# Patient Record
Sex: Male | Born: 1960 | Race: White | Hispanic: No | Marital: Married | Smoking: Never smoker
Health system: Southern US, Community
[De-identification: ages and names within clinical notes are randomized; demographics above are authoritative.]

## PROBLEM LIST (undated history)

## (undated) DIAGNOSIS — R7303 Prediabetes: Secondary | ICD-10-CM

## (undated) DIAGNOSIS — N400 Enlarged prostate without lower urinary tract symptoms: Secondary | ICD-10-CM

## (undated) DIAGNOSIS — K222 Esophageal obstruction: Secondary | ICD-10-CM

## (undated) DIAGNOSIS — M722 Plantar fascial fibromatosis: Secondary | ICD-10-CM

## (undated) DIAGNOSIS — G2581 Restless legs syndrome: Secondary | ICD-10-CM

## (undated) DIAGNOSIS — N62 Hypertrophy of breast: Secondary | ICD-10-CM

## (undated) DIAGNOSIS — E0590 Thyrotoxicosis, unspecified without thyrotoxic crisis or storm: Secondary | ICD-10-CM

## (undated) DIAGNOSIS — Z8639 Personal history of other endocrine, nutritional and metabolic disease: Secondary | ICD-10-CM

## (undated) DIAGNOSIS — E039 Hypothyroidism, unspecified: Secondary | ICD-10-CM

## (undated) DIAGNOSIS — K219 Gastro-esophageal reflux disease without esophagitis: Secondary | ICD-10-CM

## (undated) DIAGNOSIS — G4730 Sleep apnea, unspecified: Secondary | ICD-10-CM

## (undated) DIAGNOSIS — J849 Interstitial pulmonary disease, unspecified: Secondary | ICD-10-CM

## (undated) DIAGNOSIS — H33311 Horseshoe tear of retina without detachment, right eye: Secondary | ICD-10-CM

## (undated) DIAGNOSIS — I4891 Unspecified atrial fibrillation: Secondary | ICD-10-CM

## (undated) DIAGNOSIS — Z8619 Personal history of other infectious and parasitic diseases: Secondary | ICD-10-CM

## (undated) HISTORY — PX: FRACTURE SURGERY: SHX138

## (undated) HISTORY — DX: Interstitial pulmonary disease, unspecified: J84.9

## (undated) HISTORY — DX: Personal history of other infectious and parasitic diseases: Z86.19

## (undated) HISTORY — PX: UVULOPALATOPHARYNGOPLASTY: SHX827

## (undated) HISTORY — PX: COLONOSCOPY WITH ESOPHAGOGASTRODUODENOSCOPY (EGD): SHX5779

## (undated) HISTORY — PX: ATRIAL FIBRILLATION ABLATION: SHX5732

## (undated) HISTORY — DX: Unspecified atrial fibrillation: I48.91

## (undated) HISTORY — PX: BREAST BIOPSY: SHX20

## (undated) HISTORY — DX: Horseshoe tear of retina without detachment, right eye: H33.311

## (undated) HISTORY — DX: Benign prostatic hyperplasia without lower urinary tract symptoms: N40.0

---

## 2006-01-12 ENCOUNTER — Ambulatory Visit: Payer: Self-pay | Admitting: Gastroenterology

## 2006-09-06 ENCOUNTER — Emergency Department: Payer: Self-pay | Admitting: Emergency Medicine

## 2006-09-06 ENCOUNTER — Other Ambulatory Visit: Payer: Self-pay

## 2006-12-16 ENCOUNTER — Ambulatory Visit: Payer: Self-pay | Admitting: Internal Medicine

## 2006-12-21 DIAGNOSIS — Z8619 Personal history of other infectious and parasitic diseases: Secondary | ICD-10-CM

## 2006-12-21 HISTORY — DX: Personal history of other infectious and parasitic diseases: Z86.19

## 2006-12-23 ENCOUNTER — Ambulatory Visit: Payer: Self-pay | Admitting: Internal Medicine

## 2007-01-20 ENCOUNTER — Emergency Department: Payer: Self-pay | Admitting: Emergency Medicine

## 2007-11-15 ENCOUNTER — Ambulatory Visit: Payer: Self-pay | Admitting: Gastroenterology

## 2008-12-21 HISTORY — PX: BUNIONECTOMY: SHX129

## 2009-12-21 DIAGNOSIS — D352 Benign neoplasm of pituitary gland: Secondary | ICD-10-CM

## 2009-12-21 DIAGNOSIS — I959 Hypotension, unspecified: Secondary | ICD-10-CM | POA: Diagnosis present

## 2009-12-21 HISTORY — DX: Benign neoplasm of pituitary gland: D35.2

## 2009-12-21 HISTORY — PX: PITUITARY EXCISION: SHX745

## 2010-02-07 ENCOUNTER — Ambulatory Visit: Payer: Self-pay | Admitting: Urology

## 2010-05-21 ENCOUNTER — Ambulatory Visit: Payer: Self-pay | Admitting: Internal Medicine

## 2010-06-18 ENCOUNTER — Ambulatory Visit: Payer: Self-pay | Admitting: Neurosurgery

## 2010-12-17 ENCOUNTER — Ambulatory Visit: Payer: Self-pay | Admitting: Neurosurgery

## 2011-09-23 DIAGNOSIS — E291 Testicular hypofunction: Secondary | ICD-10-CM | POA: Insufficient documentation

## 2011-09-23 DIAGNOSIS — G2581 Restless legs syndrome: Secondary | ICD-10-CM | POA: Insufficient documentation

## 2011-09-23 DIAGNOSIS — E785 Hyperlipidemia, unspecified: Secondary | ICD-10-CM | POA: Insufficient documentation

## 2011-09-23 DIAGNOSIS — E038 Other specified hypothyroidism: Secondary | ICD-10-CM | POA: Diagnosis present

## 2011-09-23 DIAGNOSIS — D352 Benign neoplasm of pituitary gland: Secondary | ICD-10-CM | POA: Insufficient documentation

## 2012-11-04 ENCOUNTER — Ambulatory Visit: Payer: Self-pay | Admitting: Internal Medicine

## 2012-12-07 DIAGNOSIS — D497 Neoplasm of unspecified behavior of endocrine glands and other parts of nervous system: Secondary | ICD-10-CM | POA: Insufficient documentation

## 2012-12-09 ENCOUNTER — Ambulatory Visit: Payer: Self-pay | Admitting: Gastroenterology

## 2014-10-03 ENCOUNTER — Ambulatory Visit: Payer: Self-pay | Admitting: Neurological Surgery

## 2016-07-23 ENCOUNTER — Other Ambulatory Visit: Payer: Self-pay | Admitting: Gastroenterology

## 2016-07-23 DIAGNOSIS — R1310 Dysphagia, unspecified: Secondary | ICD-10-CM

## 2016-07-28 ENCOUNTER — Ambulatory Visit: Payer: Self-pay

## 2016-08-12 ENCOUNTER — Ambulatory Visit
Admission: RE | Admit: 2016-08-12 | Discharge: 2016-08-12 | Disposition: A | Discharge status: Home / Self Care | Payer: 59 | Source: Ambulatory Visit | Attending: Gastroenterology | Admitting: Gastroenterology

## 2016-08-12 DIAGNOSIS — K219 Gastro-esophageal reflux disease without esophagitis: Secondary | ICD-10-CM | POA: Insufficient documentation

## 2016-08-12 DIAGNOSIS — K228 Other specified diseases of esophagus: Secondary | ICD-10-CM | POA: Insufficient documentation

## 2016-08-12 DIAGNOSIS — R1310 Dysphagia, unspecified: Secondary | ICD-10-CM | POA: Insufficient documentation

## 2016-08-12 DIAGNOSIS — K449 Diaphragmatic hernia without obstruction or gangrene: Secondary | ICD-10-CM | POA: Insufficient documentation

## 2016-08-12 DIAGNOSIS — K222 Esophageal obstruction: Secondary | ICD-10-CM | POA: Diagnosis present

## 2016-10-26 ENCOUNTER — Encounter: Admission: RE | Payer: Self-pay | Source: Ambulatory Visit

## 2016-10-26 ENCOUNTER — Ambulatory Visit: Admission: RE | Admit: 2016-10-26 | Payer: 59 | Source: Ambulatory Visit | Admitting: Gastroenterology

## 2016-10-26 SURGERY — ESOPHAGOGASTRODUODENOSCOPY (EGD) WITH PROPOFOL
Anesthesia: General

## 2017-05-14 ENCOUNTER — Encounter: Admission: RE | Payer: Self-pay | Source: Ambulatory Visit

## 2017-05-14 ENCOUNTER — Ambulatory Visit: Admission: RE | Admit: 2017-05-14 | Payer: 59 | Source: Ambulatory Visit | Admitting: Gastroenterology

## 2017-05-14 SURGERY — ESOPHAGOGASTRODUODENOSCOPY (EGD) WITH PROPOFOL
Anesthesia: General

## 2018-05-02 DIAGNOSIS — D352 Benign neoplasm of pituitary gland: Secondary | ICD-10-CM | POA: Insufficient documentation

## 2018-05-02 DIAGNOSIS — R7303 Prediabetes: Secondary | ICD-10-CM | POA: Insufficient documentation

## 2019-02-02 DIAGNOSIS — E739 Lactose intolerance, unspecified: Secondary | ICD-10-CM | POA: Insufficient documentation

## 2019-02-07 HISTORY — PX: PARS PLANA VITRECTOMY: SHX2166

## 2020-08-06 ENCOUNTER — Other Ambulatory Visit: Payer: Self-pay | Admitting: Podiatry

## 2020-08-19 ENCOUNTER — Encounter: Payer: Self-pay | Admitting: Anesthesiology

## 2020-08-19 ENCOUNTER — Other Ambulatory Visit: Payer: Self-pay

## 2020-08-19 ENCOUNTER — Encounter: Payer: Self-pay | Admitting: Podiatry

## 2020-08-23 NOTE — Discharge Instructions (Signed)
[facility]  POST OPERATIVE INSTRUCTIONS FOR DR. TROXLER, DR. FOWLER, AND DR. [facility]   1. Take your medication as prescribed.  Pain medication should be taken only as needed.  2. Keep the dressing clean, dry and intact.  3. Keep your foot elevated above the heart level for the first 48 hours.  4. Walking to the bathroom and brief periods of walking are acceptable, unless we have instructed you to be non-weight bearing.  5. Always wear your post-op shoe when walking.  Always use your crutches if you are to be non-weight bearing.  6. Do not take a shower. Baths are permissible as long as the foot is kept out of the water.   7. Every hour you are awake:  - Bend your knee 15 times. - Flex foot 15 times - Massage calf 15 times  8. Call [facility] ([phone]) if any of the following problems occur: - You develop a temperature or fever. - The bandage becomes saturated with blood. - Medication does not stop your pain. - Injury of the foot occurs. - Any symptoms of infection including redness, odor, or red streaks running from wound.   General Anesthesia, Adult, Care After This sheet gives you information about how to care for yourself after your procedure. Your health care provider may also give you more specific instructions. If you have problems or questions, contact your health care provider. What can I expect after the procedure? After the procedure, the following side effects are common:  Pain or discomfort at the IV site.  Nausea.  Vomiting.  Sore throat.  Trouble concentrating.  Feeling cold or chills.  Weak or tired.  Sleepiness and fatigue.  Soreness and body aches. These side effects can affect parts of the body that were not involved in surgery. Follow these instructions at home:  For at least 24 hours after the procedure:  Have a responsible adult stay with you. It is  important to have someone help care for you until you are awake and alert.  Rest as needed.  Do not: ? Participate in activities in which you could fall or become injured. ? Drive. ? Use heavy machinery. ? Drink alcohol. ? Take sleeping pills or medicines that cause drowsiness. ? Make important decisions or sign legal documents. ? Take care of children on your own. Eating and drinking  Follow any instructions from your health care provider about eating or drinking restrictions.  When you feel hungry, start by eating small amounts of foods that are soft and easy to digest (bland), such as toast. Gradually return to your regular diet.  Drink enough fluid to keep your urine pale yellow.  If you vomit, rehydrate by drinking water, juice, or clear broth. General instructions  If you have sleep apnea, surgery and certain medicines can increase your risk for breathing problems. Follow instructions from your health care provider about wearing your sleep device: ? Anytime you are sleeping, including during daytime naps. ? While taking prescription pain medicines, sleeping medicines, or medicines that make you drowsy.  Return to your normal activities as told by your health care provider. Ask your health care provider what activities are safe for you.  Take over-the-counter and prescription medicines only as told by your health care provider.  If you smoke, do not smoke without supervision.  Keep all follow-up visits as told by your health care provider. This is important. Contact a health care provider if:    You have nausea or vomiting that does not get better with medicine.  You cannot eat or drink without vomiting.  You have pain that does not get better with medicine.  You are unable to pass urine.  You develop a skin rash.  You have a fever.  You have redness around your IV site that gets worse. Get help right away if:  You have difficulty breathing.  You have chest  pain.  You have blood in your urine or stool, or you vomit blood. Summary  After the procedure, it is common to have a sore throat or nausea. It is also common to feel tired.  Have a responsible adult stay with you for the first 24 hours after general anesthesia. It is important to have someone help care for you until you are awake and alert.  When you feel hungry, start by eating small amounts of foods that are soft and easy to digest (bland), such as toast. Gradually return to your regular diet.  Drink enough fluid to keep your urine pale yellow.  Return to your normal activities as told by your health care provider. Ask your health care provider what activities are safe for you. This information is not intended to replace advice given to you by your health care provider. Make sure you discuss any questions you have with your health care provider. Document Revised: 12/10/2017 Document Reviewed: 07/23/2017 Elsevier Patient Education  2020 [employer]  

## 2020-08-27 ENCOUNTER — Other Ambulatory Visit: Admission: RE | Admit: 2020-08-27 | Payer: BC Managed Care – PPO | Source: Ambulatory Visit

## 2020-08-28 ENCOUNTER — Ambulatory Visit: Admission: RE | Admit: 2020-08-28 | Payer: BC Managed Care – PPO | Source: Home / Self Care | Admitting: Podiatry

## 2020-08-28 HISTORY — DX: Gastro-esophageal reflux disease without esophagitis: K21.9

## 2020-08-28 HISTORY — DX: Hypertrophy of breast: N62

## 2020-08-28 HISTORY — DX: Personal history of other endocrine, nutritional and metabolic disease: Z86.39

## 2020-08-28 HISTORY — DX: Restless legs syndrome: G25.81

## 2020-08-28 HISTORY — DX: Thyrotoxicosis, unspecified without thyrotoxic crisis or storm: E05.90

## 2020-08-28 HISTORY — DX: Esophageal obstruction: K22.2

## 2020-08-28 HISTORY — DX: Prediabetes: R73.03

## 2020-08-28 HISTORY — DX: Plantar fascial fibromatosis: M72.2

## 2020-08-28 HISTORY — DX: Sleep apnea, unspecified: G47.30

## 2020-08-28 SURGERY — CHEILECTOMY
Anesthesia: Choice | Laterality: Left

## 2020-12-02 ENCOUNTER — Other Ambulatory Visit: Payer: Self-pay | Admitting: Internal Medicine

## 2020-12-02 DIAGNOSIS — D352 Benign neoplasm of pituitary gland: Secondary | ICD-10-CM

## 2020-12-06 ENCOUNTER — Ambulatory Visit
Admission: RE | Admit: 2020-12-06 | Discharge: 2020-12-06 | Disposition: A | Discharge status: Home / Self Care | Payer: BC Managed Care – PPO | Source: Ambulatory Visit | Attending: Internal Medicine | Admitting: Internal Medicine

## 2020-12-06 ENCOUNTER — Other Ambulatory Visit: Payer: Self-pay

## 2020-12-06 DIAGNOSIS — D352 Benign neoplasm of pituitary gland: Secondary | ICD-10-CM | POA: Insufficient documentation

## 2020-12-06 MED ORDER — GADOBUTROL 1 MMOL/ML IV SOLN
9.0000 mL | Freq: Once | INTRAVENOUS | Status: AC | PRN
  Administered 2020-12-06: 10:00:00 9 mL via INTRAVENOUS

## 2021-12-02 ENCOUNTER — Encounter: Payer: Self-pay | Admitting: Internal Medicine

## 2021-12-02 ENCOUNTER — Other Ambulatory Visit: Payer: Self-pay

## 2021-12-02 ENCOUNTER — Emergency Department: Payer: BC Managed Care – PPO

## 2021-12-02 ENCOUNTER — Inpatient Hospital Stay
Admission: EM | Admit: 2021-12-02 | Discharge: 2021-12-04 | DRG: 310 | Disposition: A | Discharge status: Home / Self Care | Payer: BC Managed Care – PPO | Attending: Internal Medicine | Admitting: Internal Medicine

## 2021-12-02 DIAGNOSIS — I4819 Other persistent atrial fibrillation: Principal | ICD-10-CM | POA: Diagnosis present

## 2021-12-02 DIAGNOSIS — E785 Hyperlipidemia, unspecified: Secondary | ICD-10-CM | POA: Diagnosis present

## 2021-12-02 DIAGNOSIS — G2581 Restless legs syndrome: Secondary | ICD-10-CM | POA: Diagnosis present

## 2021-12-02 DIAGNOSIS — D352 Benign neoplasm of pituitary gland: Secondary | ICD-10-CM | POA: Diagnosis present

## 2021-12-02 DIAGNOSIS — G4733 Obstructive sleep apnea (adult) (pediatric): Secondary | ICD-10-CM | POA: Diagnosis present

## 2021-12-02 DIAGNOSIS — R079 Chest pain, unspecified: Secondary | ICD-10-CM

## 2021-12-02 DIAGNOSIS — I420 Dilated cardiomyopathy: Secondary | ICD-10-CM

## 2021-12-02 DIAGNOSIS — R0602 Shortness of breath: Secondary | ICD-10-CM

## 2021-12-02 DIAGNOSIS — I4891 Unspecified atrial fibrillation: Secondary | ICD-10-CM | POA: Diagnosis not present

## 2021-12-02 DIAGNOSIS — I480 Paroxysmal atrial fibrillation: Secondary | ICD-10-CM

## 2021-12-02 DIAGNOSIS — K219 Gastro-esophageal reflux disease without esophagitis: Secondary | ICD-10-CM | POA: Diagnosis present

## 2021-12-02 DIAGNOSIS — I959 Hypotension, unspecified: Secondary | ICD-10-CM | POA: Diagnosis present

## 2021-12-02 DIAGNOSIS — E039 Hypothyroidism, unspecified: Secondary | ICD-10-CM | POA: Diagnosis present

## 2021-12-02 DIAGNOSIS — Z7982 Long term (current) use of aspirin: Secondary | ICD-10-CM

## 2021-12-02 DIAGNOSIS — Z79899 Other long term (current) drug therapy: Secondary | ICD-10-CM

## 2021-12-02 DIAGNOSIS — R7303 Prediabetes: Secondary | ICD-10-CM | POA: Diagnosis present

## 2021-12-02 DIAGNOSIS — Z79890 Hormone replacement therapy (postmenopausal): Secondary | ICD-10-CM

## 2021-12-02 HISTORY — DX: Hypothyroidism, unspecified: E03.9

## 2021-12-02 LAB — COMPREHENSIVE METABOLIC PANEL
ALT: 23 U/L (ref 0–44)
AST: 24 U/L (ref 15–41)
Albumin: 4.3 g/dL (ref 3.5–5.0)
Alkaline Phosphatase: 93 U/L (ref 38–126)
Anion gap: 5 (ref 5–15)
BUN: 18 mg/dL (ref 8–23)
CO2: 27 mmol/L (ref 22–32)
Calcium: 9.2 mg/dL (ref 8.9–10.3)
Chloride: 105 mmol/L (ref 98–111)
Creatinine, Ser: 1.03 mg/dL (ref 0.61–1.24)
GFR, Estimated: >60 mL/min (ref >60)
Glucose, Bld: 104 mg/dL — ABNORMAL HIGH (ref 70–99)
Potassium: 4.1 mmol/L (ref 3.5–5.1)
Sodium: 137 mmol/L (ref 135–145)
Total Bilirubin: 0.8 mg/dL (ref 0.3–1.2)
Total Protein: 7.8 g/dL (ref 6.5–8.1)

## 2021-12-02 LAB — CBC WITH DIFFERENTIAL/PLATELET
Abs Immature Granulocytes: 0.02 10*3/uL (ref 0.00–0.07)
Basophils Absolute: 0.1 10*3/uL (ref 0.0–0.1)
Basophils Relative: 1 %
Eosinophils Absolute: 0.2 10*3/uL (ref 0.0–0.5)
Eosinophils Relative: 3 %
HCT: 44.1 % (ref 39.0–52.0)
Hemoglobin: 14.8 g/dL (ref 13.0–17.0)
Immature Granulocytes: 0 %
Lymphocytes Relative: 37 %
Lymphs Abs: 2.7 10*3/uL (ref 0.7–4.0)
MCH: 31.4 pg (ref 26.0–34.0)
MCHC: 33.6 g/dL (ref 30.0–36.0)
MCV: 93.4 fL (ref 80.0–100.0)
Monocytes Absolute: 0.8 10*3/uL (ref 0.1–1.0)
Monocytes Relative: 10 %
Neutro Abs: 3.6 10*3/uL (ref 1.7–7.7)
Neutrophils Relative %: 49 %
Platelets: 257 10*3/uL (ref 150–400)
RBC: 4.72 MIL/uL (ref 4.22–5.81)
RDW: 13.5 % (ref 11.5–15.5)
WBC: 7.4 10*3/uL (ref 4.0–10.5)
nRBC: 0 % (ref 0.0–0.2)

## 2021-12-02 LAB — LACTIC ACID, PLASMA: Lactic Acid, Venous: 0.8 mmol/L (ref 0.5–1.9)

## 2021-12-02 LAB — HIV ANTIBODY (ROUTINE TESTING W REFLEX): HIV Screen 4th Generation wRfx: NONREACTIVE

## 2021-12-02 LAB — PROTIME-INR
INR: 1 (ref 0.8–1.2)
Prothrombin Time: 13.2 seconds (ref 11.4–15.2)

## 2021-12-02 LAB — HEPARIN LEVEL (UNFRACTIONATED): Heparin Unfractionated: 0.28 IU/mL — ABNORMAL LOW (ref 0.30–0.70)

## 2021-12-02 LAB — APTT: aPTT: 30 seconds (ref 24–36)

## 2021-12-02 LAB — TSH: TSH: 0.557 u[IU]/mL (ref 0.350–4.500)

## 2021-12-02 LAB — T4, FREE: Free T4: 0.89 ng/dL (ref 0.61–1.12)

## 2021-12-02 LAB — MAGNESIUM: Magnesium: 2.5 mg/dL — ABNORMAL HIGH (ref 1.7–2.4)

## 2021-12-02 LAB — TROPONIN I (HIGH SENSITIVITY)
Troponin I (High Sensitivity): 12 ng/L (ref <18)
Troponin I (High Sensitivity): 7 ng/L (ref <18)

## 2021-12-02 MED ORDER — ASPIRIN 81 MG PO CHEW
81.0000 mg | CHEWABLE_TABLET | Freq: Every day | ORAL | Status: DC
  Administered 2021-12-03 – 2021-12-04 (×2): 81 mg via ORAL
  Filled 2021-12-02 (×2): qty 1

## 2021-12-02 MED ORDER — ENOXAPARIN SODIUM 40 MG/0.4ML IJ SOSY: 40.0000 mg | PREFILLED_SYRINGE | INTRAMUSCULAR | Status: DC

## 2021-12-02 MED ORDER — LEVOTHYROXINE SODIUM 100 MCG PO TABS
100.0000 ug | ORAL_TABLET | Freq: Every day | ORAL | Status: DC
  Administered 2021-12-03 – 2021-12-04 (×2): 100 ug via ORAL
  Filled 2021-12-02: qty 1
  Filled 2021-12-02: qty 2

## 2021-12-02 MED ORDER — SODIUM CHLORIDE 0.9 % IV BOLUS
1000.0000 mL | Freq: Once | INTRAVENOUS | Status: AC
  Administered 2021-12-02: 1000 mL via INTRAVENOUS

## 2021-12-02 MED ORDER — METOPROLOL TARTRATE 25 MG PO TABS
12.5000 mg | ORAL_TABLET | Freq: Four times a day (QID) | ORAL | Status: DC
  Administered 2021-12-02 – 2021-12-03 (×3): 12.5 mg via ORAL
  Filled 2021-12-02 (×5): qty 1

## 2021-12-02 MED ORDER — GABAPENTIN 300 MG PO CAPS
600.0000 mg | ORAL_CAPSULE | Freq: Every day | ORAL | Status: DC
  Administered 2021-12-02: 300 mg via ORAL
  Administered 2021-12-03 – 2021-12-04 (×2): 600 mg via ORAL
  Filled 2021-12-02 (×3): qty 2

## 2021-12-02 MED ORDER — DILTIAZEM HCL 25 MG/5ML IV SOLN
20.0000 mg | Freq: Once | INTRAVENOUS | Status: AC
  Administered 2021-12-02: 20 mg via INTRAVENOUS
  Filled 2021-12-02: qty 5

## 2021-12-02 MED ORDER — HEPARIN BOLUS VIA INFUSION
4000.0000 [IU] | Freq: Once | INTRAVENOUS | Status: AC
  Administered 2021-12-02: 4000 [IU] via INTRAVENOUS
  Filled 2021-12-02: qty 4000

## 2021-12-02 MED ORDER — ONDANSETRON HCL 4 MG/2ML IJ SOLN: 4.0000 mg | Freq: Four times a day (QID) | INTRAMUSCULAR | Status: DC | PRN

## 2021-12-02 MED ORDER — SIMVASTATIN 20 MG PO TABS
20.0000 mg | ORAL_TABLET | Freq: Every day | ORAL | Status: DC
  Administered 2021-12-03 – 2021-12-04 (×2): 20 mg via ORAL
  Filled 2021-12-02: qty 2
  Filled 2021-12-02: qty 1

## 2021-12-02 MED ORDER — ADULT MULTIVITAMIN W/MINERALS CH
1.0000 | ORAL_TABLET | Freq: Every day | ORAL | Status: DC
  Administered 2021-12-03 – 2021-12-04 (×2): 1 via ORAL
  Filled 2021-12-02 (×2): qty 1

## 2021-12-02 MED ORDER — ACETAMINOPHEN 325 MG PO TABS: 650.0000 mg | ORAL_TABLET | ORAL | Status: DC | PRN

## 2021-12-02 MED ORDER — PANTOPRAZOLE SODIUM 40 MG PO TBEC
40.0000 mg | DELAYED_RELEASE_TABLET | Freq: Every day | ORAL | Status: DC
  Administered 2021-12-02 – 2021-12-04 (×3): 40 mg via ORAL
  Filled 2021-12-02 (×3): qty 1

## 2021-12-02 MED ORDER — HEPARIN BOLUS VIA INFUSION
1500.0000 [IU] | INTRAVENOUS | Status: AC
  Administered 2021-12-03: 1500 [IU] via INTRAVENOUS
  Filled 2021-12-02: qty 1500

## 2021-12-02 MED ORDER — HEPARIN (PORCINE) 25000 UT/250ML-% IV SOLN
1650.0000 [IU]/h | INTRAVENOUS | Status: DC
Start: 2021-12-02 — End: 2021-12-04
  Administered 2021-12-02: 1500 [IU]/h via INTRAVENOUS
  Administered 2021-12-03 (×2): 1650 [IU]/h via INTRAVENOUS
  Filled 2021-12-02 (×3): qty 250

## 2021-12-02 MED ORDER — BROMOCRIPTINE MESYLATE 2.5 MG PO TABS
1.2500 mg | ORAL_TABLET | Freq: Every day | ORAL | Status: DC
  Administered 2021-12-03 – 2021-12-04 (×2): 1.25 mg via ORAL
  Filled 2021-12-02 (×2): qty 1

## 2021-12-02 NOTE — ED Triage Notes (Signed)
Pt in with co palpitations since 0000 states does not have hx of afib but states has hx of same symptoms. Was told by pmd to come in when having symptoms. Pt states HR at home was 130's per fitbit.

## 2021-12-02 NOTE — ED Provider Notes (Signed)
[facility]  ____________________________________________   Event Date/Time   First MD Initiated Contact with Patient 12/02/21 0530     (approximate)  I have reviewed the triage vital signs and the nursing notes.   HISTORY  Chief Complaint Palpitations    HPI John Pineda. is a 60 y.o. male with past medical history of GERD, pituitary adenoma status postresection, hypothyroidism on levothyroxine who presents with palpitations.  Patient's palpitations have been going on for several months.  He recently bought a Fitbit watch and today told him he was in atrial fibrillation.  Last night he felt like his heart rate was significantly elevated.  He denies any chest pain shortness of breath nausea vomiting.  Does feel a heavy sensation on the left side of his body but he denies any weakness visual change or aphasia.  Patient has no history of atrial fibrillation.  Denies heavy alcohol use.          Past Medical History:  Diagnosis Date   Esophageal stricture    GERD (gastroesophageal reflux disease)    Gynecomastia, male    History of Epstein-Barr virus infection 2008   History of secondary hypogonadism    Hyperthyroidism    Pituitary adenoma ([place]) 2011   S/P resection   Plantar fasciitis    Pre-diabetes    RLS (restless legs syndrome)    Sleep apnea    No CPAP    There are no problems to display for this patient.   Past Surgical History:  Procedure Laterality Date   BREAST BIOPSY Right    BUNIONECTOMY Right 12/2008   Great toe   COLONOSCOPY WITH ESOPHAGOGASTRODUODENOSCOPY (EGD)     2008, 2018   FRACTURE SURGERY Right    Collarbone   PARS PLANA VITRECTOMY Right 02/07/2019   PITUITARY EXCISION  2011    Prior to Admission medications   Medication Sig Start Date End Date Taking? Authorizing Provider  ASPIRIN 81 PO Take by mouth daily.    [provider]  bromocriptine (PARLODEL) 2.5 MG tablet Take 1.25 mg by mouth daily.     [provider]  gabapentin (NEURONTIN) 300 MG capsule Take 600 mg by mouth daily.    [provider]  levothyroxine (SYNTHROID) 100 MCG tablet Take 100 mcg by mouth daily before breakfast.    [provider]  Multiple Vitamin (MULTIVITAMIN) tablet Take 1 tablet by mouth daily.    [provider]  omeprazole (PRILOSEC) 20 MG capsule Take 20 mg by mouth daily.    [provider]  simvastatin (ZOCOR) 20 MG tablet Take 20 mg by mouth daily.    [provider]  testosterone cypionate (DEPOTESTOTERONE CYPIONATE) 100 MG/ML injection Inject 200 mg into the muscle every 14 (fourteen) days. For IM use only    [provider]    Allergies Milk-related compounds  No family history on file.  Social History Social History   Tobacco Use   Smoking status: Never   Smokeless tobacco: Never  Vaping Use   Vaping Use: Never used  Substance Use Topics   Alcohol use: Yes    Alcohol/week: 2.0 standard drinks    Types: 2 Glasses of wine per week    Review of Systems   Review of Systems  Constitutional:  Negative for chills and fever.  Respiratory:  Negative for shortness of breath.   Cardiovascular:  Positive for palpitations. Negative for chest pain.  Gastrointestinal:  Negative for abdominal pain, nausea and vomiting.  All other systems reviewed and are negative.  Physical Exam Updated Vital Signs BP 91/72   Pulse (!) 110   Temp 97.9 F (36.6 C) (Oral)   Resp 13   Ht 6' (1.829 m)   Wt 99.3 kg   SpO2 100%   BMI 29.70 kg/m   Physical Exam Vitals and nursing note reviewed.  Constitutional:      General: He is not in acute distress.    Appearance: Normal appearance.  HENT:     Head: Normocephalic and atraumatic.  Eyes:     General: No scleral icterus.    Conjunctiva/sclera: Conjunctivae normal.  Cardiovascular:     Rate and Rhythm: Tachycardia present. Rhythm irregular.  Pulmonary:     Effort: Pulmonary effort is  normal. No respiratory distress.     Breath sounds: Normal breath sounds. No wheezing.  Abdominal:     General: Abdomen is flat.     Palpations: Abdomen is soft.  Musculoskeletal:        General: No deformity or signs of injury.     Cervical back: Normal range of motion.  Skin:    Coloration: Skin is not jaundiced or pale.  Neurological:     General: No focal deficit present.     Mental Status: He is alert and oriented to person, place, and time. Mental status is at baseline.     Comments: Aox3, nml speech  PERRL, EOMI, face symmetric, nml tongue movement  5/5 strength in the BL upper and lower extremities  Sensation grossly intact in the BL upper and lower extremities  Finger-nose-finger intact BL   Psychiatric:        Mood and Affect: Mood normal.        Behavior: Behavior normal.     LABS (all labs ordered are listed, but only abnormal results are displayed)  Labs Reviewed  COMPREHENSIVE METABOLIC PANEL - Abnormal; Notable for the following components:      Result Value   Glucose, Bld 104 (*)    All other components within normal limits  MAGNESIUM - Abnormal; Notable for the following components:   Magnesium 2.5 (*)    All other components within normal limits  CBC WITH DIFFERENTIAL/PLATELET  TSH  T4, FREE  TROPONIN I (HIGH SENSITIVITY)  TROPONIN I (HIGH SENSITIVITY)   ____________________________________________  EKG  Atrial fibrillation with rapid ventricular response, normal axis, otherwise normal intervals, no acute ischemic changes ____________________________________________  RADIOLOGY Almeta Monas, personally viewed and evaluated these images (plain radiographs) as part of my medical decision making, as well as reviewing the written report by the radiologist.  ED MD interpretation:  I reviewed the CXR which does not show any acute cardiopulmonary process '     ____________________________________________   PROCEDURES  Procedure(s)  performed (including Critical Care):  .Critical Care Performed by: Rada Hay, MD Authorized by: Rada Hay, MD   Critical care provider statement:    Critical care time (minutes):  30   Critical care was time spent personally by me on the following activities:  Development of treatment plan with patient or surrogate, discussions with consultants, evaluation of patient's response to treatment, examination of patient, ordering and review of laboratory studies, ordering and review of radiographic studies, ordering and performing treatments and interventions, pulse oximetry, re-evaluation of patient's condition and review of old charts   ____________________________________________   INITIAL IMPRESSION / [place] / ED COURSE     Patient is a 60 year old male who presents with  new onset atrial fibrillation.  Has been experience palpitations for several months of unclear exactly when this started.  He is minimally symptomatic at this time no chest pain or dyspnea.  Heart rates between 1 teens to 140s.  Patient tells me his blood pressure is always low, initially systolic in the 86V but he did have some hypotension with blood pressures in the 70s over 50s however heart rate really only in the 130s so unlikely contributing.  He was given 2 L of IV saline with improvement in his pressure.  Plan to give IV diltiazem with attempt to rate control.  His CHA2DS2-VASc score is 0 so can likely hold on anticoagulation.  At the time of signout he is pending reassessment after the IV Dilt.       ____________________________________________   FINAL CLINICAL IMPRESSION(S) / ED DIAGNOSES  Final diagnoses:  Chest pain     ED Discharge Orders     None        Note:  This document was prepared using Dragon voice recognition software and may include unintentional dictation errors.    Rada Hay, MD 12/02/21 [phone]

## 2021-12-02 NOTE — ED Provider Notes (Signed)
----------------------------------------- °  10:31 AM on 12/02/2021 -----------------------------------------  I took over care of this patient from Dr. Starleen Blue.  The patient was treated for rapid atrial fibrillation with diltiazem.  He was hypotensive initially when his heart rate was in the 130s.  He is now rate controlled but despite this, and 2 L of normal saline, he remains hypotensive (although with a MAP above 65).  He is alert and does not have any acute complaints.  Given the persistent hypotension and new onset atrial fibrillation I do not feel will be appropriate to discharge him home with rate control and outpatient follow-up.  I consulted Dr. Saunders Revel from cardiology as well as Dr. [name] from the hospitalist service for admission.   Arta Silence, MD 12/02/21 1032

## 2021-12-02 NOTE — ED Provider Notes (Addendum)
HPI: Pt is a 60 y.o. male who presents with complaints of palpitations.  The patient p/w  palpitations starting today,not as fast now.   ROS: Denies fever, chest pain, vomiting  Past Medical History:  Diagnosis Date   Esophageal stricture    GERD (gastroesophageal reflux disease)    Gynecomastia, male    History of Epstein-Barr virus infection 2008   History of secondary hypogonadism    Hyperthyroidism    Pituitary adenoma ([place]) 2011   S/P resection   Plantar fasciitis    Pre-diabetes    RLS (restless legs syndrome)    Sleep apnea    No CPAP   There were no vitals filed for this visit.  Focused Physical Exam: Gen: No acute distress Head: atraumatic, normocephalic Eyes: Extraocular movements grossly intact; conjunctiva clear CV: RRR Lung: No increased WOB, no stridor GI: ND, no obvious masses Neuro: Alert and awake  Medical Decision Making and Plan: Given the patient's initial medical screening exam, the following diagnostic evaluation has been ordered. The patient will be placed in the appropriate treatment space, once one is available, to complete the evaluation and treatment. I have discussed the plan of care with the patient and I have advised the patient that an ED physician or mid-level practitioner will reevaluate their condition after the test results have been received, as the results may give them additional insight into the type of treatment they may need.   Diagnostics: labs, ekg   Treatments: none immediately   Vanessa Menno, MD 12/02/21 0086    Vanessa [place], MD 12/02/21 [phone]

## 2021-12-02 NOTE — Consult Note (Signed)
[place] for IV Heparin Indication: atrial fibrillation  Patient Measurements: Height: 6' (182.9 cm) Weight: 99.3 kg (219 lb) IBW/kg (Calculated) : 77.6 Heparin Dosing Weight: 97.7 kg  Labs: Recent Labs    12/02/21 0518  HGB 14.8  HCT 44.1  PLT 257  CREATININE 1.03  TROPONINIHS 12    Estimated Creatinine Clearance: 91.9 mL/min (by C-G formula based on SCr of 1.03 mg/dL).   Medical History: Past Medical History:  Diagnosis Date   Esophageal stricture    GERD (gastroesophageal reflux disease)    Gynecomastia, male    History of Epstein-Barr virus infection 2008   History of secondary hypogonadism    Hypothyroidism    Pituitary adenoma ([place]) 2011   S/P resection   Plantar fasciitis    Pre-diabetes    RLS (restless legs syndrome)    Sleep apnea    No CPAP    Medications:  No anticoagulation prior to admission per my chart review  Assessment: Patient is a 60 y/o M with medical history as above who presented with palpitations and is now being admitted with new-onset Afib with RVR. Pharmacy consulted to initiate heparin infusion for Afib.   Baseline CBC acceptable. Baseline aPTT and PT-INR are pending.   Goal of Therapy:  Heparin level 0.3-0.7 units/ml Monitor platelets by anticoagulation protocol: Yes   Plan:  --Heparin 4000 unit IV bolus followed by continuous infusion at 1500 units/hr --Heparin level 6 hours after initiation of infusion --Daily CBC per protocol while on IV heparin  Benita Gutter 12/02/2021,2:19 PM

## 2021-12-02 NOTE — Consult Note (Signed)
Cardiology Consult    Patient ID: John Pineda. MRN: [id number], DOB/AGE: June 13, 1961   Admit date: 12/02/2021 Date of Consult: 12/02/2021  Primary Physician: Derinda Late, MD Primary Cardiologist: Nelva Bush, MD Requesting Provider: Milon Dikes, MD  Patient Profile    John Pineda. is a 60 y.o. male with a history of GERD, HL, Epstein-Barr, pituitary adenoma s/p resection, OSA (not on CPAP), and hypothyroidism who is being seen today for the evaluation of rapid afib at the request of Dr. [name].  Past Medical History   Past Medical History:  Diagnosis Date   Esophageal stricture    GERD (gastroesophageal reflux disease)    Gynecomastia, male    History of Epstein-Barr virus infection 2008   History of secondary hypogonadism    Hypothyroidism    Pituitary adenoma ([place]) 2011   S/P resection   Plantar fasciitis    Pre-diabetes    RLS (restless legs syndrome)    Sleep apnea    No CPAP    Past Surgical History:  Procedure Laterality Date   BREAST BIOPSY Right    BUNIONECTOMY Right 12/2008   Great toe   COLONOSCOPY WITH ESOPHAGOGASTRODUODENOSCOPY (EGD)     2008, 2018   FRACTURE SURGERY Right    Collarbone   PARS PLANA VITRECTOMY Right 02/07/2019   PITUITARY EXCISION  2011   UVULOPALATOPHARYNGOPLASTY       Allergies  Allergies  Allergen Reactions   Milk-Related Compounds Diarrhea    History of Present Illness    60 y.o. male with a history of GERD, HL, Epstein-Barr, pituitary adenoma s/p resection, OSA (not on CPAP), and hypothyroidism.  He has no known h/o CAD or FH of premature CAD.  He does have a h/o intermittent tachypalpitatons that are generally brief in nature.  He's mentioned to his PCP before but has never seen cardiology or wore a cardiac monitor.  On Thanksgiving Day, he felt bad throughout the day, noting DOE and palpitations, though this eventually resolved.    On the evening of 12/12, he noted tachypalpitations, and his fitbit  alerted him that his HR was >120.  He noted mild nausea w/ some left arm discomfort described as a "numb, tingling sensation."  Around 3am, he was alerted by his watch, that he was in Afib in the 130's.  He awoke his wife and she drove him to the ED around 5am.  Here, he was found to be in afib w/ RVR @ 134 bpm.  BP was soft, in the 90's, dropping to the 70's, assoc w/ dizziness.  BP improved slightly w/ IVF.  Once blood pressure improved, he was given a diltiazem bolus with improvement in rates into the 80s, times several hours, with relatively stable but soft blood pressures.  Rates have since come back up into the 130s and above.  Patient continues to note mild tingling sensation in his left arm but is in no acute distress.  Inpatient Medications     aspirin  81 mg Oral Daily   bromocriptine  1.25 mg Oral Daily   gabapentin  600 mg Oral Daily   [START ON 12/03/2021] levothyroxine  100 mcg Oral Q0600   metoprolol tartrate  12.5 mg Oral Q6H   multivitamin with minerals  1 tablet Oral Daily   pantoprazole  40 mg Oral Daily   simvastatin  20 mg Oral Daily    Family History    History reviewed. No pertinent family history. He indicated that his mother is deceased.  He indicated that his father is deceased.  Social History    Social History   Socioeconomic History   Marital status: Married    Spouse name: Not on file   Number of children: Not on file   Years of education: Not on file   Highest education level: Not on file  Occupational History   Not on file  Tobacco Use   Smoking status: Never   Smokeless tobacco: Never  Vaping Use   Vaping Use: Never used  Substance and Sexual Activity   Alcohol use: Yes    Alcohol/week: 2.0 standard drinks    Types: 2 Glasses of wine per week    Comment: occasional drink   Drug use: Never   Sexual activity: Not on file  Other Topics Concern   Not on file  Social History Narrative   Lives locally w/ wife.  Mare Ferrari - very active.  Rides  mountain bike fairly regularly w/o limitations.   Social Determinants of Health   Financial Resource Strain: Not on file  Food Insecurity: Not on file  Transportation Needs: Not on file  Physical Activity: Not on file  Stress: Not on file  Social Connections: Not on file  Intimate Partner Violence: Not on file    Review of Systems    General:  No chills, fever, night sweats or weight changes.  Cardiovascular: He has lightheadedness this morning in the setting of low blood pressures.  +++  Tachypalpitations today and on previous occasions.  No chest pain, dyspnea on exertion, edema, orthopnea, paroxysmal nocturnal dyspnea. Dermatological: No rash, lesions/masses Respiratory: No cough, dyspnea Urologic: No hematuria, dysuria Abdominal:   No nausea, vomiting, diarrhea, bright red blood per rectum, melena, or hematemesis Neurologic:  No visual changes, wkns, changes in mental status. All other systems reviewed and are otherwise negative except as noted above.  Physical Exam    Blood pressure 96/63, pulse (!) 113, temperature 97.9 F (36.6 C), temperature source Oral, resp. rate 19, height 6' (1.829 m), weight 99.3 kg, SpO2 97 %.  General: Pleasant, NAD Psych: Normal affect. Neuro: Alert and oriented X 3. Moves all extremities spontaneously. HEENT: Normal  Neck: Supple without bruits or JVD. Lungs:  Resp regular and unlabored, CTA. Heart: Irregularly irregular, no s3, s4, or murmurs. Abdomen: Soft, non-tender, non-distended, BS + x 4.  Extremities: No clubbing, cyanosis or edema. DP/PT2+, Radials 2+ and equal bilaterally.  Labs    Cardiac Enzymes Recent Labs  Lab 12/02/21 0518 12/02/21 1509  TROPONINIHS 12 7      Lab Results  Component Value Date   WBC 7.4 12/02/2021   HGB 14.8 12/02/2021   HCT 44.1 12/02/2021   MCV 93.4 12/02/2021   PLT 257 12/02/2021    Recent Labs  Lab 12/02/21 0518  NA 137  K 4.1  CL 105  CO2 27  BUN 18  CREATININE 1.03  CALCIUM 9.2   PROT 7.8  BILITOT 0.8  ALKPHOS 93  ALT 23  AST 24  GLUCOSE 104*   Lab Results  Component Value Date   TSH 0.557 12/02/2021      Radiology Studies    DG Chest Port 1 View  Result Date: 12/02/2021 CLINICAL DATA:  Chest pain EXAM: PORTABLE CHEST 1 VIEW COMPARISON:  None. FINDINGS: The heart size and mediastinal contours are within normal limits. Both lungs are clear. The visualized skeletal structures are unremarkable. IMPRESSION: Negative portable chest. Electronically Signed   By: Jorje Guild M.D.   On: 12/02/2021 05:39  ECG & Cardiac Imaging    Afib, 134, nonspecific ST changes - personally reviewed.  Assessment & Plan    1.  Paroxysmal atrial fibrillation with rapid ventricular response: Patient with prior history of tachypalpitations and more prolonged episode on Thanksgiving, who developed recurrent tachypalpitations on the evening of December 12 with rates into the 120s and 130s.  He presented to the emergency department this morning and was found to be in A. fib with RVR.  He was hypotensive and lightheaded and required IV fluid resuscitation.  He was then given a diltiazem bolus with improvement in rates times several hours though rates are trending back up.  He notes a mild discomfort and tingling/numb sensation in his left arm but has not had any chest pain.  Troponins are normal.  He is otherwise comfortable.  Potassium, magnesium, and TSH are normal.  We will add heparin and metoprolol 12.5 mg every 6 hours for rate control for now.  We will keep n.p.o. after midnight and if he remains in atrial fibrillation, will need to consider TEE and cardioversion tomorrow.  CHA2DS2-VASc equals 0, thus may only require oral anticoagulation for 4 weeks following cardioversion.  Follow-up echo.  Prior history of sleep apnea but not using CPAP-May require repeat outpatient sleep study for titration.  Would likely benefit from early evaluation by electrophysiology in the outpatient  setting.  2.  Hypotension: Blood pressures currently stable but required IV fluid resuscitation earlier.  Follow on low-dose beta-blocker.  Suspect pressures will improve as rates improved.  3.  Hypothyroidism: TSH normal.  Signed, Murray Hodgkins, NP 12/02/2021, 4:54 PM  For questions or updates, please contact   Please consult [email or web address] for contact info under Cardiology/STEMI.

## 2021-12-02 NOTE — ED Notes (Signed)
While standing at bedside to use urinal this pt's HR increased to 130-140s range, upon getting back into bed pt's HR decreased back to 70s-80s within 2 minutes of sitting.

## 2021-12-02 NOTE — H&P (Signed)
History and Physical    John Pineda. WJX:[id number] DOB: [birth year] DOA: 12/02/2021  PCP: Derinda Late, MD   Patient coming from: Home  I have personally briefly reviewed patient's old medical records in [place]  Chief Complaint: Palpitations  HPI: John Pineda. is a 60 y.o. male with medical history significant for pituitary adenoma s/p resection, secondary hypogonadism, hypothyroidism, sleep apnea who presents to the emergency room via private vehicle for evaluation of palpitations. Patient states her symptoms started about 10 PM the night prior to his admission.  He felt like his heart was racing and on his Fitbit his heart rate was in the 130s that he was in A. fib. He was able to go to sleep and woke up again around 4 AM with the sensation that his heart was racing and the second time he had some chest discomfort associated with nausea but no vomiting.  He denies having any diaphoresis.  He denies having any shortness of breath even though his wife states that about 3 weeks ago he had an episode of exertional shortness of breath that was unusual for him. Upon arrival to the ER, he was noted to be in atrial fibrillation with a rapid ventricular rate.  He was also hypotensive with systolic blood pressure in the 90s and then dropped to the 70s.  He responded to IV fluid resuscitation and received 2 L IV fluid bolus with saline. During my evaluation, at rest his blood pressure is 90 systolic and he denies feeling dizzy or lightheaded. He has a history of low blood pressure and states that usually when he stands up suddenly he gets dizzy.  He denies any loss of consciousness. He denies having any fever, no chills, no abdominal pain, no urinary frequency, no nocturia, no dysuria, no headache, no cough, no leg swelling, no PND, no focal deficit or blurred vision. Sodium 137, potassium 4.1, chloride 105, bicarb 27, glucose 104, BUN 18, creatinine 1.03, calcium 9.2,  magnesium 2.5, alkaline phosphatase 93, albumin 4.3, AST 24, ALT 23, total protein 7.8, white count 7.4, hemoglobin 14.8, hematocrit 44.1, MCV 93.4, RDW 13.5, platelet count 257, TSH 0.557, T4 0.89 Chest x-ray reviewed by me shows no acute cardiopulmonary disease. Twelve-lead EKG shows atrial fibrillation, rate controlled    ER: Patient is a 60 year old male who presents to the ER for evaluation of palpitations and was noted to have new onset A. Fib with rapid ventricular rate He was initially hypotensive with blood pressure of 90 systolic and this later dropped to the 21H systolic.  He responded to IV fluid resuscitation and was placed on a Cardizem drip. He is currently rate controlled and his systolic blood pressure is in the 90s. Cardiology has been consulted He will be referred to observation status for further evaluation.  Review of Systems: As per HPI otherwise all other systems reviewed and negative.    Past Medical History:  Diagnosis Date   Esophageal stricture    GERD (gastroesophageal reflux disease)    Gynecomastia, male    History of Epstein-Barr virus infection 2008   History of secondary hypogonadism    Hyperthyroidism    Pituitary adenoma ([place]) 2011   S/P resection   Plantar fasciitis    Pre-diabetes    RLS (restless legs syndrome)    Sleep apnea    No CPAP    Past Surgical History:  Procedure Laterality Date   BREAST BIOPSY Right    BUNIONECTOMY Right 12/2008   [facility]  toe   COLONOSCOPY WITH ESOPHAGOGASTRODUODENOSCOPY (EGD)     2008, 2018   FRACTURE SURGERY Right    Collarbone   PARS PLANA VITRECTOMY Right 02/07/2019   PITUITARY EXCISION  2011     reports that he has never smoked. He has never used smokeless tobacco. He reports current alcohol use of about 2.0 standard drinks per week. No history on file for drug use.  Allergies  Allergen Reactions   Milk-Related Compounds Diarrhea    History reviewed. No pertinent family history.  No family  history of CAD   Prior to Admission medications   Medication Sig Start Date End Date Taking? Authorizing Provider  ASPIRIN 81 PO Take by mouth daily.    [provider]  bromocriptine (PARLODEL) 2.5 MG tablet Take 1.25 mg by mouth daily.    [provider]  gabapentin (NEURONTIN) 300 MG capsule Take 600 mg by mouth daily.    [provider]  levothyroxine (SYNTHROID) 100 MCG tablet Take 100 mcg by mouth daily before breakfast.    [provider]  Multiple Vitamin (MULTIVITAMIN) tablet Take 1 tablet by mouth daily.    [provider]  omeprazole (PRILOSEC) 20 MG capsule Take 20 mg by mouth daily.    [provider]  simvastatin (ZOCOR) 20 MG tablet Take 20 mg by mouth daily.    [provider]  testosterone cypionate (DEPOTESTOTERONE CYPIONATE) 100 MG/ML injection Inject 200 mg into the muscle every 14 (fourteen) days. For IM use only    [provider]    Physical Exam: Vitals:   12/02/21 0930 12/02/21 1000 12/02/21 1012 12/02/21 1027  BP: (!) 88/58 (!) 90/59    Pulse: 87 73 61 85  Resp: 20 16  20   Temp:      TempSrc:      SpO2: 99% 100%  99%  Weight:      Height:         Vitals:   12/02/21 0930 12/02/21 1000 12/02/21 1012 12/02/21 1027  BP: (!) 88/58 (!) 90/59    Pulse: 87 73 61 85  Resp: 20 16  20   Temp:      TempSrc:      SpO2: 99% 100%  99%  Weight:      Height:          Constitutional: Alert and oriented x 3 . Not in any apparent distress HEENT:      Head: Normocephalic and atraumatic.         Eyes: PERLA, EOMI, Conjunctivae are normal. Sclera is non-icteric.       Mouth/Throat: Mucous membranes are moist.       Neck: Supple with no signs of meningismus. Cardiovascular: Irregularly irregular. No murmurs, gallops, or rubs. 2+ symmetrical distal pulses are present . No JVD. No LE edema Respiratory: Respiratory effort normal .Lungs sounds clear bilaterally. No wheezes, crackles, or rhonchi.   Gastrointestinal: Soft, non tender, and non distended with positive bowel sounds.  Genitourinary: No CVA tenderness. Musculoskeletal: Nontender with normal range of motion in all extremities. No cyanosis, or erythema of extremities. Neurologic:  Face is symmetric. Moving all extremities. No gross focal neurologic deficits . Skin: Skin is warm, dry.  No rash or ulcers Psychiatric: Mood and affect are normal    Labs on Admission: I have personally reviewed following labs and imaging studies  CBC: Recent Labs  Lab 12/02/21 0518  WBC 7.4  NEUTROABS 3.6  HGB 14.8  HCT 44.1  MCV 93.4  PLT  097   Basic Metabolic Panel: Recent Labs  Lab 12/02/21 0518  NA 137  K 4.1  CL 105  CO2 27  GLUCOSE 104*  BUN 18  CREATININE 1.03  CALCIUM 9.2  MG 2.5*   GFR: Estimated Creatinine Clearance: 91.9 mL/min (by C-G formula based on SCr of 1.03 mg/dL). Liver Function Tests: Recent Labs  Lab 12/02/21 0518  AST 24  ALT 23  ALKPHOS 93  BILITOT 0.8  PROT 7.8  ALBUMIN 4.3   No results for input(s): LIPASE, AMYLASE in the last 168 hours. No results for input(s): AMMONIA in the last 168 hours. Coagulation Profile: No results for input(s): INR, PROTIME in the last 168 hours. Cardiac Enzymes: No results for input(s): CKTOTAL, CKMB, CKMBINDEX, TROPONINI in the last 168 hours. BNP (last 3 results) No results for input(s): PROBNP in the last 8760 hours. HbA1C: No results for input(s): HGBA1C in the last 72 hours. CBG: No results for input(s): GLUCAP in the last 168 hours. Lipid Profile: No results for input(s): CHOL, HDL, LDLCALC, TRIG, CHOLHDL, LDLDIRECT in the last 72 hours. Thyroid Function Tests: Recent Labs    12/02/21 0518  TSH 0.557  FREET4 0.89   Anemia Panel: No results for input(s): VITAMINB12, FOLATE, FERRITIN, TIBC, IRON, RETICCTPCT in the last 72 hours. Urine analysis: No results found for: COLORURINE, APPEARANCEUR, LABSPEC, [place], GLUCOSEU, HGBUR, BILIRUBINUR,  KETONESUR, PROTEINUR, UROBILINOGEN, NITRITE, LEUKOCYTESUR  Radiological Exams on Admission: DG Chest Port 1 View  Result Date: 12/02/2021 CLINICAL DATA:  Chest pain EXAM: PORTABLE CHEST 1 VIEW COMPARISON:  None. FINDINGS: The heart size and mediastinal contours are within normal limits. Both lungs are clear. The visualized skeletal structures are unremarkable. IMPRESSION: Negative portable chest. Electronically Signed   By: Jorje Guild M.D.   On: 12/02/2021 05:39     Assessment/Plan Principal Problem:   Atrial fibrillation with rapid ventricular response (HCC) Active Problems:   GERD (gastroesophageal reflux disease)   Pituitary adenoma (HCC)   Hypotension     Patient is a 60 year old who presents to the ER for evaluation of palpitations and is found to be in A. fib with a rapid ventricular rate.   Atrial fibrillation with rapid ventricular rate New onset Patient has a CHA2DS2-VASc score of 0 He received IV Cardizem in the ER and is currently rate controlled Will obtain 2D echocardiogram to assess LVEF and rule out valvular pathology Continue aspirin Cardiology consult    Hypotension Chronic and asymptomatic    History of pituitary adenoma Status post resection Continue bromocriptine, levothyroxine and testosterone supplementation    GERD Continue PPI   DVT prophylaxis: Lovenox  Code Status: full code  Family Communication: Greater than 50% of time was spent discussing patient's condition and plan of care with him at the bedside.  All questions and concerns have been addressed.  He verbalizes understanding and agrees with the plan. Disposition Plan: Back to previous home environment Consults called: Cardiology Status:At the time of admission, it appears that the appropriate admission status for this patient is inpatient. This is judged to be reasonable and necessary to provide the required intensity of service to ensure the patient's safety given the  presenting symptoms, physical exam findings, and initial radiographic and laboratory data in the context of their comorbid conditions. Patient requires inpatient status due to high intensity of service, high risk for further deterioration and high frequency of surveillance required.     Collier Bullock MD [facility]     12/02/2021, 11:47 AM

## 2021-12-02 NOTE — Consult Note (Signed)
[facility] for IV Heparin Indication: atrial fibrillation  Patient Measurements: Height: 6' (182.9 cm) Weight: 99.3 kg (219 lb) IBW/kg (Calculated) : 77.6 Heparin Dosing Weight: 97.7 kg  Labs: Recent Labs    12/02/21 0518 12/02/21 1509 12/02/21 2212  HGB 14.8  --   --   HCT 44.1  --   --   PLT 257  --   --   APTT  --  30  --   LABPROT  --  13.2  --   INR  --  1.0  --   HEPARINUNFRC  --   --  0.28*  CREATININE 1.03  --   --   TROPONINIHS 12 7  --      Estimated Creatinine Clearance: 91.9 mL/min (by C-G formula based on SCr of 1.03 mg/dL).   Medical History: Past Medical History:  Diagnosis Date   Esophageal stricture    GERD (gastroesophageal reflux disease)    Gynecomastia, male    History of Epstein-Barr virus infection 2008   History of secondary hypogonadism    Hypothyroidism    Pituitary adenoma ([place]) 2011   S/P resection   Plantar fasciitis    Pre-diabetes    RLS (restless legs syndrome)    Sleep apnea    No CPAP    Medications:  No anticoagulation prior to admission per my chart review  Assessment: Patient is a 60 y/o M with medical history as above who presented with palpitations and is now being admitted with new-onset Afib with RVR. Pharmacy consulted to initiate heparin infusion for Afib.   Baseline CBC acceptable. Baseline aPTT and PT-INR are pending.   Goal of Therapy:  Heparin level 0.3-0.7 units/ml Monitor platelets by anticoagulation protocol: Yes  12/13 2212 HL 0.28, subtherapeutic   Plan:  --Heparin 1500 unit IV bolus followed by increase infusion to 1650 units/hr --Recheck HL with AM labs following rate change --Daily CBC per protocol while on IV heparin  Renda Rolls, PharmD, [facility] 12/02/2021 11:11 PM

## 2021-12-03 ENCOUNTER — Observation Stay (HOSPITAL_COMMUNITY)
Admit: 2021-12-03 | Discharge: 2021-12-03 | Disposition: A | Discharge status: Home / Self Care | Payer: BC Managed Care – PPO | Attending: Internal Medicine | Admitting: Internal Medicine

## 2021-12-03 DIAGNOSIS — Z79890 Hormone replacement therapy (postmenopausal): Secondary | ICD-10-CM | POA: Diagnosis not present

## 2021-12-03 DIAGNOSIS — I4891 Unspecified atrial fibrillation: Secondary | ICD-10-CM | POA: Diagnosis present

## 2021-12-03 DIAGNOSIS — Z79899 Other long term (current) drug therapy: Secondary | ICD-10-CM | POA: Diagnosis not present

## 2021-12-03 DIAGNOSIS — Z7982 Long term (current) use of aspirin: Secondary | ICD-10-CM | POA: Diagnosis not present

## 2021-12-03 DIAGNOSIS — G4733 Obstructive sleep apnea (adult) (pediatric): Secondary | ICD-10-CM | POA: Diagnosis present

## 2021-12-03 DIAGNOSIS — I420 Dilated cardiomyopathy: Secondary | ICD-10-CM | POA: Diagnosis present

## 2021-12-03 DIAGNOSIS — E7849 Other hyperlipidemia: Secondary | ICD-10-CM

## 2021-12-03 DIAGNOSIS — E785 Hyperlipidemia, unspecified: Secondary | ICD-10-CM | POA: Diagnosis present

## 2021-12-03 DIAGNOSIS — I9589 Other hypotension: Secondary | ICD-10-CM | POA: Diagnosis not present

## 2021-12-03 DIAGNOSIS — R079 Chest pain, unspecified: Secondary | ICD-10-CM | POA: Diagnosis not present

## 2021-12-03 DIAGNOSIS — K219 Gastro-esophageal reflux disease without esophagitis: Secondary | ICD-10-CM

## 2021-12-03 DIAGNOSIS — D352 Benign neoplasm of pituitary gland: Secondary | ICD-10-CM | POA: Diagnosis not present

## 2021-12-03 DIAGNOSIS — I959 Hypotension, unspecified: Secondary | ICD-10-CM | POA: Diagnosis present

## 2021-12-03 DIAGNOSIS — I4819 Other persistent atrial fibrillation: Secondary | ICD-10-CM | POA: Diagnosis present

## 2021-12-03 DIAGNOSIS — R7303 Prediabetes: Secondary | ICD-10-CM | POA: Diagnosis present

## 2021-12-03 DIAGNOSIS — E039 Hypothyroidism, unspecified: Secondary | ICD-10-CM | POA: Diagnosis present

## 2021-12-03 DIAGNOSIS — G2581 Restless legs syndrome: Secondary | ICD-10-CM | POA: Diagnosis present

## 2021-12-03 LAB — CBC
HCT: 40.1 % (ref 39.0–52.0)
Hemoglobin: 13.5 g/dL (ref 13.0–17.0)
MCH: 31.4 pg (ref 26.0–34.0)
MCHC: 33.7 g/dL (ref 30.0–36.0)
MCV: 93.3 fL (ref 80.0–100.0)
Platelets: 213 10*3/uL (ref 150–400)
RBC: 4.3 MIL/uL (ref 4.22–5.81)
RDW: 13.9 % (ref 11.5–15.5)
WBC: 6.2 10*3/uL (ref 4.0–10.5)
nRBC: 0 % (ref 0.0–0.2)

## 2021-12-03 LAB — BASIC METABOLIC PANEL
Anion gap: 6 (ref 5–15)
BUN: 20 mg/dL (ref 8–23)
CO2: 26 mmol/L (ref 22–32)
Calcium: 8.5 mg/dL — ABNORMAL LOW (ref 8.9–10.3)
Chloride: 105 mmol/L (ref 98–111)
Creatinine, Ser: 0.88 mg/dL (ref 0.61–1.24)
GFR, Estimated: >60 mL/min (ref >60)
Glucose, Bld: 103 mg/dL — ABNORMAL HIGH (ref 70–99)
Potassium: 4.2 mmol/L (ref 3.5–5.1)
Sodium: 137 mmol/L (ref 135–145)

## 2021-12-03 LAB — ECHOCARDIOGRAM COMPLETE
AR max vel: 4.24 cm2
AV Area VTI: 4.2 cm2
AV Area mean vel: 4.21 cm2
AV Mean grad: 1 mmHg
AV Peak grad: 2.6 mmHg
Ao pk vel: 0.81 m/s
Area-P 1/2: 3.11 cm2
Height: 72 in
MV VTI: 6 cm2
S' Lateral: 3.8 cm
Weight: 3504 oz

## 2021-12-03 LAB — HEPARIN LEVEL (UNFRACTIONATED)
Heparin Unfractionated: 0.61 IU/mL (ref 0.30–0.70)
Heparin Unfractionated: 0.68 IU/mL (ref 0.30–0.70)

## 2021-12-03 MED ORDER — DIGOXIN 0.25 MG/ML IJ SOLN
0.2500 mg | Freq: Once | INTRAMUSCULAR | Status: AC
  Administered 2021-12-03: 20:00:00 0.25 mg via INTRAVENOUS
  Filled 2021-12-03 (×2): qty 2

## 2021-12-03 MED ORDER — DIGOXIN 0.25 MG/ML IJ SOLN
0.2500 mg | Freq: Once | INTRAMUSCULAR | Status: AC
  Administered 2021-12-03: 12:00:00 0.25 mg via INTRAVENOUS
  Filled 2021-12-03 (×2): qty 2

## 2021-12-03 MED ORDER — DIGOXIN 125 MCG PO TABS
0.1250 mg | ORAL_TABLET | Freq: Every day | ORAL | Status: DC
  Administered 2021-12-04: 0.125 mg via ORAL
  Filled 2021-12-03: qty 1

## 2021-12-03 MED ORDER — DIGOXIN 0.25 MG/ML IJ SOLN: 0.2500 mg | Freq: Once | INTRAMUSCULAR | Status: DC

## 2021-12-03 NOTE — Progress Notes (Signed)
*  PRELIMINARY RESULTS* Echocardiogram 2D Echocardiogram has been performed.  John Pineda 12/03/2021, 10:54 AM

## 2021-12-03 NOTE — ED Notes (Signed)
Message sent to Sharolyn Douglas, NP regarding pt's BP of 80/69.

## 2021-12-03 NOTE — Plan of Care (Signed)

## 2021-12-03 NOTE — Consult Note (Signed)
[facility] for IV Heparin Indication: atrial fibrillation  Patient Measurements: Height: 6' (182.9 cm) Weight: 99.3 kg (219 lb) IBW/kg (Calculated) : 77.6 Heparin Dosing Weight: 97.7 kg  Labs: Recent Labs    12/02/21 0518 12/02/21 1509 12/02/21 2212 12/03/21 0525  HGB 14.8  --   --  13.5  HCT 44.1  --   --  40.1  PLT 257  --   --  213  APTT  --  30  --   --   LABPROT  --  13.2  --   --   INR  --  1.0  --   --   HEPARINUNFRC  --   --  0.28* 0.61  CREATININE 1.03  --   --  0.88  TROPONINIHS 12 7  --   --      Estimated Creatinine Clearance: 107.6 mL/min (by C-G formula based on SCr of 0.88 mg/dL).   Medical History: Past Medical History:  Diagnosis Date   Esophageal stricture    GERD (gastroesophageal reflux disease)    Gynecomastia, male    History of Epstein-Barr virus infection 2008   History of secondary hypogonadism    Hypothyroidism    Pituitary adenoma ([place]) 2011   S/P resection   Plantar fasciitis    Pre-diabetes    RLS (restless legs syndrome)    Sleep apnea    No CPAP    Medications:  No anticoagulation prior to admission per my chart review  Assessment: Patient is a 60 y/o M with medical history as above who presented with palpitations and is now being admitted with new-onset Afib with RVR. Pharmacy consulted to initiate heparin infusion for Afib.   Baseline CBC acceptable. Baseline aPTT and PT-INR are pending.   Goal of Therapy:  Heparin level 0.3-0.7 units/ml Monitor platelets by anticoagulation protocol: Yes  12/13 2212 HL 0.28, subtherapeutic 12/14 0525 HL 0.61, therapeutic x 1    Plan:  --Continue heparin infusion at 1650 units/hr --Recheck HL in 6 hours to confirm --Daily CBC per protocol while on IV heparin  Renda Rolls, PharmD, [facility] 12/03/2021 6:31 AM

## 2021-12-03 NOTE — ED Notes (Signed)
Jimmye Norman, MD at bedside.

## 2021-12-03 NOTE — Progress Notes (Signed)
PROGRESS NOTE    John Pineda.  XFG:[id number] DOB: 03-Feb-1961 DOA: 12/02/2021 PCP: Derinda Late, MD   Assessment & Plan:   Principal Problem:   Atrial fibrillation with rapid ventricular response ([place]) Active Problems:   GERD (gastroesophageal reflux disease)   Pituitary adenoma ([place])   Hypotension   A. fib: w/ RVR. New onset. CHA2DS2-VASc score of 0. Continue on IV heparin drip. Possible TEE and cardioversion tomorrow as per cardio. NPO after midnight. Continue on digoxin, metoprolol as per cardio. Continue on tele. Cardio following and recs apprec   Hypotension: chronic and asymptomatic. Keep MAP > 65    History of pituitary adenoma: s/p resection. Continue bromocriptine, levothyroxine and testosterone supplementation   GERD: continue on PPI  HLD: continue on statin     DVT prophylaxis: IV heparin  Code Status: full  Family Communication: discussed pt's care w/ pt's family at bedside and answered their questions  Disposition Plan: likely d/c back home  Level of care: Telemetry Cardiac  Status is: Inpatient  Remains inpatient appropriate because: severity of illness, possible TEE and cardioversion tomorrow     Consultants:  cardio  Procedures:  Antimicrobials:    Subjective: Pt c/o occasional heart flutter.   Objective: Vitals:   12/03/21 0500 12/03/21 0530 12/03/21 0630 12/03/21 0900  BP: 98/79 94/71 106/80 106/69  Pulse: (!) 124 90 (!) 115 80  Resp: 20 15 (!) 25 17  Temp:      TempSrc:      SpO2: 97% 95% 98% 100%  Weight:      Height:       No intake or output data in the 24 hours ending 12/03/21 0905 Filed Weights   12/02/21 0512  Weight: 99.3 kg    Examination:  General exam: Appears calm and comfortable  Respiratory system: Clear to auscultation. Respiratory effort normal. Cardiovascular system: irregularly irregular. No rubs, gallops or clicks. No pedal edema. Gastrointestinal system: Abdomen is nondistended, soft and  nontender. Normal bowel sounds heard. Central nervous system: Alert and oriented. Moves all extremities  Psychiatry: Judgement and insight appear normal. Mood & affect appropriate.     Data Reviewed: I have personally reviewed following labs and imaging studies  CBC: Recent Labs  Lab 12/02/21 0518 12/03/21 0525  WBC 7.4 6.2  NEUTROABS 3.6  --   HGB 14.8 13.5  HCT 44.1 40.1  MCV 93.4 93.3  PLT 257 967   Basic Metabolic Panel: Recent Labs  Lab 12/02/21 0518 12/03/21 0525  NA 137 137  K 4.1 4.2  CL 105 105  CO2 27 26  GLUCOSE 104* 103*  BUN 18 20  CREATININE 1.03 0.88  CALCIUM 9.2 8.5*  MG 2.5*  --    GFR: Estimated Creatinine Clearance: 107.6 mL/min (by C-G formula based on SCr of 0.88 mg/dL). Liver Function Tests: Recent Labs  Lab 12/02/21 0518  AST 24  ALT 23  ALKPHOS 93  BILITOT 0.8  PROT 7.8  ALBUMIN 4.3   No results for input(s): LIPASE, AMYLASE in the last 168 hours. No results for input(s): AMMONIA in the last 168 hours. Coagulation Profile: Recent Labs  Lab 12/02/21 1509  INR 1.0   Cardiac Enzymes: No results for input(s): CKTOTAL, CKMB, CKMBINDEX, TROPONINI in the last 168 hours. BNP (last 3 results) No results for input(s): PROBNP in the last 8760 hours. HbA1C: No results for input(s): HGBA1C in the last 72 hours. CBG: No results for input(s): GLUCAP in the last 168 hours. Lipid Profile: No results  for input(s): CHOL, HDL, LDLCALC, TRIG, CHOLHDL, LDLDIRECT in the last 72 hours. Thyroid Function Tests: Recent Labs    12/02/21 0518  TSH 0.557  FREET4 0.89   Anemia Panel: No results for input(s): VITAMINB12, FOLATE, FERRITIN, TIBC, IRON, RETICCTPCT in the last 72 hours. Sepsis Labs: Recent Labs  Lab 12/02/21 1020  LATICACIDVEN 0.8    No results found for this or any previous visit (from the past 240 hour(s)).       Radiology Studies: DG Chest Port 1 View  Result Date: 12/02/2021 CLINICAL DATA:  Chest pain EXAM: PORTABLE  CHEST 1 VIEW COMPARISON:  None. FINDINGS: The heart size and mediastinal contours are within normal limits. Both lungs are clear. The visualized skeletal structures are unremarkable. IMPRESSION: Negative portable chest. Electronically Signed   By: Jorje Guild M.D.   On: 12/02/2021 05:39        Scheduled Meds:  aspirin  81 mg Oral Daily   bromocriptine  1.25 mg Oral Daily   gabapentin  600 mg Oral Daily   levothyroxine  100 mcg Oral Q0600   metoprolol tartrate  12.5 mg Oral Q6H   multivitamin with minerals  1 tablet Oral Daily   pantoprazole  40 mg Oral Daily   simvastatin  20 mg Oral Daily   Continuous Infusions:  heparin 1,650 Units/hr (12/03/21 0418)     LOS: 0 days    Time spent: 33 mins     Wyvonnia Dusky, MD [facility] Pager [phone]  If 7PM-7AM, please contact night-coverage 12/03/2021, 9:05 AM

## 2021-12-03 NOTE — ED Notes (Signed)
Patient resting comfortably in recliner. No needs expressed at this time. NAD noted.

## 2021-12-03 NOTE — Consult Note (Signed)
[place] for IV Heparin Indication: atrial fibrillation  Patient Measurements: Height: 6' (182.9 cm) Weight: 99.3 kg (219 lb) IBW/kg (Calculated) : 77.6 Heparin Dosing Weight: 97.7 kg  Labs: Recent Labs    12/02/21 0518 12/02/21 1509 12/02/21 2212 12/03/21 0525 12/03/21 1149  HGB 14.8  --   --  13.5  --   HCT 44.1  --   --  40.1  --   PLT 257  --   --  213  --   APTT  --  30  --   --   --   LABPROT  --  13.2  --   --   --   INR  --  1.0  --   --   --   HEPARINUNFRC  --   --  0.28* 0.61 0.68  CREATININE 1.03  --   --  0.88  --   TROPONINIHS 12 7  --   --   --      Estimated Creatinine Clearance: 107.6 mL/min (by C-G formula based on SCr of 0.88 mg/dL).   Medical History: Past Medical History:  Diagnosis Date   Esophageal stricture    GERD (gastroesophageal reflux disease)    Gynecomastia, male    History of Epstein-Barr virus infection 2008   History of secondary hypogonadism    Hypothyroidism    Pituitary adenoma ([place]) 2011   S/P resection   Plantar fasciitis    Pre-diabetes    RLS (restless legs syndrome)    Sleep apnea    No CPAP    Medications:  No anticoagulation prior to admission per chart review  Assessment: Patient is a 60 y/o M with medical history as above who presented with palpitations and is now being admitted with new-onset Afib with RVR. Pharmacy consulted to initiate heparin infusion for Afib. Baseline CBC acceptable  Goal of Therapy:  Heparin level 0.3-0.7 units/ml Monitor platelets by anticoagulation protocol: Yes  12/13 2212 HL 0.28, subtherapeutic 12/14 0525 HL 0.61, therapeutic x 1 12/14 1149 HL 0.68, therapeutic x 2     Plan:  --Continue heparin infusion at 1650 units/hr --Recheck HL with AM labs --Daily CBC per protocol while on IV heparin  Sherilyn Banker, PharmD Clinical Pharmacist 12/03/2021 12:10 PM

## 2021-12-03 NOTE — Progress Notes (Signed)
Progress Note  Patient Name: John Pineda. Date of Encounter: 12/03/2021  Primary Cardiologist: Nelva Bush, MD  Subjective   HRs still trending 90's to 130's - increase w/ minimal activity.  Somewhat fatigued.  Notices palps.  No c/p or sob.  Inpatient Medications    Scheduled Meds:  aspirin  81 mg Oral Daily   bromocriptine  1.25 mg Oral Daily   digoxin  0.25 mg Intravenous Once   gabapentin  600 mg Oral Daily   levothyroxine  100 mcg Oral Q0600   metoprolol tartrate  12.5 mg Oral Q6H   multivitamin with minerals  1 tablet Oral Daily   pantoprazole  40 mg Oral Daily   simvastatin  20 mg Oral Daily   Continuous Infusions:  heparin 1,650 Units/hr (12/03/21 0418)   PRN Meds: acetaminophen, ondansetron (ZOFRAN) IV   Vital Signs    Vitals:   12/03/21 0900 12/03/21 0930 12/03/21 1000 12/03/21 1030  BP: 106/69 98/74 (!) 88/73 98/74  Pulse: 80 88 90 85  Resp: 17 19 13 18   Temp:      TempSrc:      SpO2: 100% 98% 94% 98%  Weight:      Height:       No intake or output data in the 24 hours ending 12/03/21 1153 Filed Weights   12/02/21 0512  Weight: 99.3 kg    Physical Exam   GEN: Well nourished, well developed, in no acute distress.  HEENT: Grossly normal.  Neck: Supple, no JVD, carotid bruits, or masses. Cardiac: IR, IR, no murmurs, rubs, or gallops. No clubbing, cyanosis, edema.  Radials 2+, DP/PT 2+ and equal bilaterally.  Respiratory:  Respirations regular and unlabored, clear to auscultation bilaterally. GI: Soft, nontender, nondistended, BS + x 4. MS: no deformity or atrophy. Skin: warm and dry, no rash. Neuro:  Strength and sensation are intact. Psych: AAOx3.  Normal affect.  Labs    Chemistry Recent Labs  Lab 12/02/21 0518 12/03/21 0525  NA 137 137  K 4.1 4.2  CL 105 105  CO2 27 26  GLUCOSE 104* 103*  BUN 18 20  CREATININE 1.03 0.88  CALCIUM 9.2 8.5*  PROT 7.8  --   ALBUMIN 4.3  --   AST 24  --   ALT 23  --   ALKPHOS 93   --   BILITOT 0.8  --   GFRNONAA >60 >60  ANIONGAP 5 6     Hematology Recent Labs  Lab 12/02/21 0518 12/03/21 0525  WBC 7.4 6.2  RBC 4.72 4.30  HGB 14.8 13.5  HCT 44.1 40.1  MCV 93.4 93.3  MCH 31.4 31.4  MCHC 33.6 33.7  RDW 13.5 13.9  PLT 257 213    Cardiac Enzymes  Recent Labs  Lab 12/02/21 0518 12/02/21 1509  TROPONINIHS 12 7      Radiology    DG Chest Port 1 View  Result Date: 12/02/2021 CLINICAL DATA:  Chest pain EXAM: PORTABLE CHEST 1 VIEW COMPARISON:  None. FINDINGS: The heart size and mediastinal contours are within normal limits. Both lungs are clear. The visualized skeletal structures are unremarkable. IMPRESSION: Negative portable chest. Electronically Signed   By: Jorje Guild M.D.   On: 12/02/2021 05:39    Telemetry    Afib - 90's to 130's - Personally Reviewed  Cardiac Studies   2D Echocardiogram pending  Patient Profile     60 y.o. male with a history of GERD, HL, Epstein-Barr, pituitary adenoma s/p resection,  OSA (not on CPAP), and hypothyroidism, who presented to the ED 12/13 w/ a 12+ hr h/o elevated HRs and palpitations and found to be in rapid afib assoc w/ hypotension.  Assessment & Plan    1.  Persistent Afib:  Prior h/o tachypalps that resurfaced on the evening of 12/12 and persisted into the 12/13, prompting him to present to the ED, where he was found to be in rapid Afib, and also hypotensive req IVF.  We added low dose ? blocker on 12/13, along w/ IV heparin, in hopes that his rates might improve, however, rates remain in the 90-130's range, and he continues to note palpitations and some fatigue.  BPs remain soft, sometimes dipping into the 80's, thus limiting further titration of ? blocker or addition of dilt.  We will load w/ IV digoxin today (0.25mg  x 3, 6 hrs apart) and plan to start 0.125mg  daily tomorrow w/ digoxin level in the AM.  Due to lack of floor beds and inability to have pt either have TEE/DCCV and return to ED afterward, or  perform TEE/DCCV in ED today, we will have to defer TEE/DCCV until tomorrow.  Of course, if rates improve or he converts today, he could likely be discharged (pending echo) on rate control and oral anticoagulation w/ plan for outpt f/u and DCCV after 3-4 wks of uninterrupted [place].  2.  Hypotension:  Pressures remain soft.  ? blocker had to be held this AM due to pressures in the 80's.  Follow.  3.  Hypothyroidism:  TSH nl.  Signed, Murray Hodgkins, NP  12/03/2021, 11:53 AM    For questions or updates, please contact   Please consult [email or web address] for contact info under Cardiology/STEMI.

## 2021-12-03 NOTE — ED Notes (Signed)
Patient sleeping at this time. NAD noted. Respirations even and unlabored. 

## 2021-12-04 ENCOUNTER — Encounter
Admission: EM | Disposition: A | Discharge status: Home / Self Care | Payer: Self-pay | Source: Home / Self Care | Attending: Internal Medicine

## 2021-12-04 ENCOUNTER — Other Ambulatory Visit: Payer: Self-pay

## 2021-12-04 ENCOUNTER — Inpatient Hospital Stay: Payer: BC Managed Care – PPO | Admitting: Certified Registered Nurse Anesthetist

## 2021-12-04 DIAGNOSIS — I9589 Other hypotension: Secondary | ICD-10-CM

## 2021-12-04 DIAGNOSIS — R0602 Shortness of breath: Secondary | ICD-10-CM

## 2021-12-04 DIAGNOSIS — D352 Benign neoplasm of pituitary gland: Secondary | ICD-10-CM

## 2021-12-04 DIAGNOSIS — R079 Chest pain, unspecified: Secondary | ICD-10-CM

## 2021-12-04 DIAGNOSIS — I420 Dilated cardiomyopathy: Secondary | ICD-10-CM

## 2021-12-04 HISTORY — DX: Shortness of breath: R06.02

## 2021-12-04 LAB — CBC
HCT: 41.3 % (ref 39.0–52.0)
Hemoglobin: 14 g/dL (ref 13.0–17.0)
MCH: 31.7 pg (ref 26.0–34.0)
MCHC: 33.9 g/dL (ref 30.0–36.0)
MCV: 93.4 fL (ref 80.0–100.0)
Platelets: 212 10*3/uL (ref 150–400)
RBC: 4.42 MIL/uL (ref 4.22–5.81)
RDW: 13.8 % (ref 11.5–15.5)
WBC: 5.6 10*3/uL (ref 4.0–10.5)
nRBC: 0 % (ref 0.0–0.2)

## 2021-12-04 LAB — BASIC METABOLIC PANEL
Anion gap: 7 (ref 5–15)
BUN: 23 mg/dL (ref 8–23)
CO2: 25 mmol/L (ref 22–32)
Calcium: 8.7 mg/dL — ABNORMAL LOW (ref 8.9–10.3)
Chloride: 105 mmol/L (ref 98–111)
Creatinine, Ser: 0.93 mg/dL (ref 0.61–1.24)
GFR, Estimated: >60 mL/min (ref >60)
Glucose, Bld: 103 mg/dL — ABNORMAL HIGH (ref 70–99)
Potassium: 4.5 mmol/L (ref 3.5–5.1)
Sodium: 137 mmol/L (ref 135–145)

## 2021-12-04 LAB — DIGOXIN LEVEL: Digoxin Level: 0.5 ng/mL — ABNORMAL LOW (ref 0.8–2.0)

## 2021-12-04 LAB — HEPARIN LEVEL (UNFRACTIONATED): Heparin Unfractionated: 0.56 IU/mL (ref 0.30–0.70)

## 2021-12-04 SURGERY — ECHOCARDIOGRAM, TRANSESOPHAGEAL
Anesthesia: General

## 2021-12-04 MED ORDER — APIXABAN 5 MG PO TABS: 5.0000 mg | ORAL_TABLET | Freq: Two times a day (BID) | ORAL | Status: DC

## 2021-12-04 MED ORDER — SODIUM CHLORIDE 0.9 % IV SOLN: INTRAVENOUS | Status: DC

## 2021-12-04 MED ORDER — PROPOFOL 500 MG/50ML IV EMUL
INTRAVENOUS | Status: AC
  Filled 2021-12-04: qty 100

## 2021-12-04 MED ORDER — APIXABAN 5 MG PO TABS: 5.0000 mg | ORAL_TABLET | Freq: Two times a day (BID) | ORAL | 0 refills | Status: AC

## 2021-12-04 MED ORDER — METOPROLOL SUCCINATE ER 25 MG PO TB24: 12.5000 mg | ORAL_TABLET | Freq: Every day | ORAL | Status: DC

## 2021-12-04 MED ORDER — DIGOXIN 125 MCG PO TABS: 0.1250 mg | ORAL_TABLET | Freq: Every day | ORAL | 0 refills | Status: DC

## 2021-12-04 MED ORDER — METOPROLOL SUCCINATE ER 25 MG PO TB24: 12.5000 mg | ORAL_TABLET | Freq: Every evening | ORAL | 0 refills | Status: DC

## 2021-12-04 NOTE — Discharge Summary (Addendum)
Physician Discharge Summary  John Pineda. WJX:[id number] DOB: January 24, 1961 DOA: 12/02/2021  PCP: Derinda Late, MD  Admit date: 12/02/2021 Discharge date: 12/04/2021  Admitted From: home  Disposition:  home   Recommendations for Outpatient Follow-up:  Follow up with PCP in 1-2 weeks F/u w/ cardio, Dr. Saunders Revel, in 1-2 weeks  Home Health: no  Equipment/Devices:  Discharge Condition: stable  CODE STATUS: full  Diet recommendation: regular  Brief/Interim Summary: HPI was taken from Dr. [name]: John Pineda. is a 60 y.o. male with medical history significant for pituitary adenoma s/p resection, secondary hypogonadism, hypothyroidism, sleep apnea who presents to the emergency room via private vehicle for evaluation of palpitations. Patient states her symptoms started about 10 PM the night prior to his admission.  He felt like his heart was racing and on his Fitbit his heart rate was in the 130s that he was in A. fib. He was able to go to sleep and woke up again around 4 AM with the sensation that his heart was racing and the second time he had some chest discomfort associated with nausea but no vomiting.  He denies having any diaphoresis.  He denies having any shortness of breath even though his wife states that about 3 weeks ago he had an episode of exertional shortness of breath that was unusual for him. Upon arrival to the ER, he was noted to be in atrial fibrillation with a rapid ventricular rate.  He was also hypotensive with systolic blood pressure in the 90s and then dropped to the 70s.  He responded to IV fluid resuscitation and received 2 L IV fluid bolus with saline. During my evaluation, at rest his blood pressure is 90 systolic and he denies feeling dizzy or lightheaded. He has a history of low blood pressure and states that usually when he stands up suddenly he gets dizzy.  He denies any loss of consciousness. He denies having any fever, no chills, no abdominal pain, no  urinary frequency, no nocturia, no dysuria, no headache, no cough, no leg swelling, no PND, no focal deficit or blurred vision. Sodium 137, potassium 4.1, chloride 105, bicarb 27, glucose 104, BUN 18, creatinine 1.03, calcium 9.2, magnesium 2.5, alkaline phosphatase 93, albumin 4.3, AST 24, ALT 23, total protein 7.8, white count 7.4, hemoglobin 14.8, hematocrit 44.1, MCV 93.4, RDW 13.5, platelet count 257, TSH 0.557, T4 0.89 Chest x-ray reviewed by me shows no acute cardiopulmonary disease. Twelve-lead EKG shows atrial fibrillation, rate controlled       ER: Patient is a 60 year old male who presents to the ER for evaluation of palpitations and was noted to have new onset A. Fib with rapid ventricular rate He was initially hypotensive with blood pressure of 90 systolic and this later dropped to the 21H systolic.  He responded to IV fluid resuscitation and was placed on a Cardizem drip. He is currently rate controlled and his systolic blood pressure is in the 90s. Cardiology has been consulted He will be referred to observation status for further evaluation.   Hospital course from Dr. Jimmye Norman 12/14-12/15/22: Pt was found to have a. fib w/ RVR. Pt was started on digoxin, metoprolol & IV heparin. Pt was scheduled to have TEE w/ possible cardioversion but pt converted to NSR prior. TEE w/ possible cardioversion was canceled. Pt was d/c home w/ po metoprolol, digoxin and eliquis as per cardio. Pt will f/u w/ cardio, Dr. Saunders Revel, in 1-2 weeks. Pt received education on increased bleeding risk of eliquis  alone as well as with NSAIDs and/or aspirin. Pt verbalized his understanding.   Discharge Diagnoses:  Principal Problem:   Atrial fibrillation with rapid ventricular response (HCC) Active Problems:   GERD (gastroesophageal reflux disease)   Pituitary adenoma (HCC)   Hypotension   Dilated cardiomyopathy (HCC)   SOB (shortness of breath) A. fib: w/ RVR. New onset. CHA2DS2-VASc score of 0. D/c IV heparin  drip & start eliquis. Converted to NSR so TEE w/ possible cardioversion was d/c. Continue on digoxin, metoprolol as per cardio. Continue on tele. Echo shows EF 39-76%, diastolic function is indeterminate & LV global hypokinesis. Cardio following and recs apprec  Cardiomyopathy: as per cardio. Unknown type. Continue on digoxin, metoprolol as per cardio    Hypotension: chronic and asymptomatic. Keep MAP > 65    History of pituitary adenoma: s/p resection. Continue bromocriptine, levothyroxine and testosterone supplementation   GERD: continue on PPI  HLD: continue on statin    Discharge Instructions  Discharge Instructions     Amb referral to [facility]   Complete by: As directed    Diet general   Complete by: As directed    Discharge instructions   Complete by: As directed    F/u w/ PCP in 1-2 weeks. F/u w/ cardio, Dr. Saunders Revel, in 1-2 weeks   Increase activity slowly   Complete by: As directed       Allergies as of 12/04/2021   No Active Allergies      Medication List     STOP taking these medications    ASPIRIN 81 PO       TAKE these medications    apixaban 5 MG Tabs tablet Commonly known as: ELIQUIS Take 1 tablet (5 mg total) by mouth 2 (two) times daily.   bromocriptine 2.5 MG tablet Commonly known as: PARLODEL Take 1.25 mg by mouth daily.   digoxin 0.125 MG tablet Commonly known as: LANOXIN Take 1 tablet (0.125 mg total) by mouth daily. Start taking on: December 05, 2021   gabapentin 300 MG capsule Commonly known as: NEURONTIN Take 600 mg by mouth daily.   levothyroxine 100 MCG tablet Commonly known as: SYNTHROID Take 100 mcg by mouth daily before breakfast.   metoprolol succinate 25 MG 24 hr tablet Commonly known as: TOPROL-XL Take 0.5 tablets (12.5 mg total) by mouth every evening.   multivitamin tablet Take 1 tablet by mouth daily.   omeprazole 20 MG capsule Commonly known as: PRILOSEC Take 20 mg by mouth daily.   simvastatin 20 MG  tablet Commonly known as: ZOCOR Take 20 mg by mouth daily.   testosterone cypionate 100 MG/ML injection Commonly known as: DEPOTESTOTERONE CYPIONATE Inject 200 mg into the muscle every 14 (fourteen) days. For IM use only        Follow-up Information     Paraschos, Alexander, MD In 1 week.   Specialty: Cardiology Contact information: [facility] [id number] [phone]                No Active Allergies  Consultations: cardio   Procedures/Studies: DG Chest Port 1 View  Result Date: 12/02/2021 CLINICAL DATA:  Chest pain EXAM: PORTABLE CHEST 1 VIEW COMPARISON:  None. FINDINGS: The heart size and mediastinal contours are within normal limits. Both lungs are clear. The visualized skeletal structures are unremarkable. IMPRESSION: Negative portable chest. Electronically Signed   By: Jorje Guild M.D.   On: 12/02/2021 05:39   ECHOCARDIOGRAM COMPLETE  Result Date: 12/03/2021  ECHOCARDIOGRAM REPORT   Patient Name:   John Pineda. Date of Exam: 12/03/2021 Medical Rec #:  [id number]            Height:       72.0 in Accession #:    [id number]           Weight:       219.0 lb Date of Birth:  06-29-1961             BSA:          2.214 m Patient Age:    60 years             BP:           106/69 mmHg Patient Gender: M                    HR:           108 bpm. Exam Location:  [facility] Procedure: 2D Echo, Color Doppler and Cardiac Doppler Indications:     I48.91 Atrial fibrillation  History:         Patient has no prior history of Echocardiogram examinations.                  Risk Factors:Sleep Apnea.  Sonographer:     Charmayne Sheer Referring Phys:  [id number] AGBATA Diagnosing Phys: Kate Sable MD  Sonographer Comments: Suboptimal subcostal window. IMPRESSIONS  1. Left ventricular ejection fraction, by estimation, is 35 to 40%. The left ventricle has moderately decreased function. The left ventricle demonstrates global  hypokinesis. Left ventricular diastolic parameters are indeterminate.  2. Right ventricular systolic function is low normal. The right ventricular size is normal.  3. Left atrial size was mild to moderately dilated.  4. The mitral valve is normal in structure. No evidence of mitral valve regurgitation.  5. The aortic valve is tricuspid. Aortic valve regurgitation is not visualized.  6. Aortic dilatation noted. There is borderline dilatation of the aortic root, measuring 38 mm. FINDINGS  Left Ventricle: Left ventricular ejection fraction, by estimation, is 35 to 40%. The left ventricle has moderately decreased function. The left ventricle demonstrates global hypokinesis. The left ventricular internal cavity size was normal in size. There is no left ventricular hypertrophy. Left ventricular diastolic parameters are indeterminate. Right Ventricle: The right ventricular size is normal. No increase in right ventricular wall thickness. Right ventricular systolic function is low normal. Left Atrium: Left atrial size was mild to moderately dilated. Right Atrium: Right atrial size was normal in size. Pericardium: There is no evidence of pericardial effusion. Mitral Valve: The mitral valve is normal in structure. No evidence of mitral valve regurgitation. MV peak gradient, 1.8 mmHg. The mean mitral valve gradient is 1.0 mmHg. Tricuspid Valve: The tricuspid valve is normal in structure. Tricuspid valve regurgitation is trivial. Aortic Valve: The aortic valve is tricuspid. Aortic valve regurgitation is not visualized. Aortic valve mean gradient measures 1.0 mmHg. Aortic valve peak gradient measures 2.6 mmHg. Aortic valve area, by VTI measures 4.20 cm. Pulmonic Valve: The pulmonic valve was normal in structure. Pulmonic valve regurgitation is not visualized. Aorta: Aortic dilatation noted. There is borderline dilatation of the aortic root, measuring 38 mm. Venous: The inferior vena cava was not well visualized. IAS/Shunts: No  atrial level shunt detected by color flow Doppler.  LEFT VENTRICLE PLAX 2D LVIDd:         4.67 cm   Diastology LVIDs:  3.80 cm   LV e' medial:    10.30 cm/s LV PW:         1.16 cm   LV E/e' medial:  6.0 LV IVS:        0.82 cm   LV e' lateral:   14.40 cm/s LVOT diam:     2.40 cm   LV E/e' lateral: 4.3 LV SV:         58 LV SV Index:   26 LVOT Area:     4.52 cm  RIGHT VENTRICLE RV Basal diam:  3.93 cm LEFT ATRIUM             Index        RIGHT ATRIUM           Index LA diam:        3.70 cm 1.67 cm/m   RA Area:     20.80 cm LA Vol (A2C):   58.7 ml 26.51 ml/m  RA Volume:   59.90 ml  27.05 ml/m LA Vol (A4C):   91.3 ml 41.23 ml/m LA Biplane Vol: 73.2 ml 33.06 ml/m  AORTIC VALVE                    PULMONIC VALVE AV Area (Vmax):    4.24 cm     PV Vmax:       0.78 m/s AV Area (Vmean):   4.21 cm     PV Vmean:      57.100 cm/s AV Area (VTI):     4.20 cm     PV VTI:        0.154 m AV Vmax:           81.10 cm/s   PV Peak grad:  2.4 mmHg AV Vmean:          56.900 cm/s  PV Mean grad:  1.0 mmHg AV VTI:            0.139 m AV Peak Grad:      2.6 mmHg AV Mean Grad:      1.0 mmHg LVOT Vmax:         76.00 cm/s LVOT Vmean:        53.000 cm/s LVOT VTI:          0.129 m LVOT/AV VTI ratio: 0.93  AORTA Ao Root diam: 3.80 cm MITRAL VALVE MV Area (PHT): 3.11 cm    SHUNTS MV Area VTI:   6.00 cm    Systemic VTI:  0.13 m MV Peak grad:  1.8 mmHg    Systemic Diam: 2.40 cm MV Mean grad:  1.0 mmHg MV Vmax:       0.67 m/s MV Vmean:      45.8 cm/s MV Decel Time: 244 msec MV E velocity: 61.45 cm/s Kate Sable MD Electronically signed by Kate Sable MD Signature Date/Time: 12/03/2021/6:56:49 PM    Final    (Echo, Carotid, EGD, Colonoscopy, ERCP)    Subjective: Pt denies any complaints    Discharge Exam: Vitals:   12/04/21 0727 12/04/21 1202  BP: 105/76 102/71  Pulse: 70 76  Resp: 18 18  Temp: 98.1 F (36.7 C) 98.2 F (36.8 C)  SpO2: 100% 100%   Vitals:   12/04/21 0052 12/04/21 0508 12/04/21 0727 12/04/21  1202  BP: (!) 86/59 107/77 105/76 102/71  Pulse: (!) 53 (!) 56 70 76  Resp: 20 17 18 18   Temp: (!) 97.3 F (36.3 C) 98.3 F (36.8 C) 98.1 F (36.7  C) 98.2 F (36.8 C)  TempSrc:    Oral  SpO2: 95% 97% 100% 100%  Weight:      Height:        General: Pt is alert, awake, not in acute distress Cardiovascular:  S1/S2 +, no rubs, no gallops Respiratory: CTA bilaterally, no wheezing, no rhonchi Abdominal: Soft, NT, ND, bowel sounds + Extremities: no edema, no cyanosis    The results of significant diagnostics from this hospitalization (including imaging, microbiology, ancillary and laboratory) are listed below for reference.     Microbiology: No results found for this or any previous visit (from the past 240 hour(s)).   Labs: BNP (last 3 results) No results for input(s): BNP in the last 8760 hours. Basic Metabolic Panel: Recent Labs  Lab 12/02/21 0518 12/03/21 0525 12/04/21 0629  NA 137 137 137  K 4.1 4.2 4.5  CL 105 105 105  CO2 27 26 25   GLUCOSE 104* 103* 103*  BUN 18 20 23   CREATININE 1.03 0.88 0.93  CALCIUM 9.2 8.5* 8.7*  MG 2.5*  --   --    Liver Function Tests: Recent Labs  Lab 12/02/21 0518  AST 24  ALT 23  ALKPHOS 93  BILITOT 0.8  PROT 7.8  ALBUMIN 4.3   No results for input(s): LIPASE, AMYLASE in the last 168 hours. No results for input(s): AMMONIA in the last 168 hours. CBC: Recent Labs  Lab 12/02/21 0518 12/03/21 0525 12/04/21 0629  WBC 7.4 6.2 5.6  NEUTROABS 3.6  --   --   HGB 14.8 13.5 14.0  HCT 44.1 40.1 41.3  MCV 93.4 93.3 93.4  PLT 257 213 212   Cardiac Enzymes: No results for input(s): CKTOTAL, CKMB, CKMBINDEX, TROPONINI in the last 168 hours. BNP: Invalid input(s): POCBNP CBG: No results for input(s): GLUCAP in the last 168 hours. D-Dimer No results for input(s): DDIMER in the last 72 hours. Hgb A1c No results for input(s): HGBA1C in the last 72 hours. Lipid Profile No results for input(s): CHOL, HDL, LDLCALC, TRIG,  CHOLHDL, LDLDIRECT in the last 72 hours. Thyroid function studies Recent Labs    12/02/21 0518  TSH 0.557   Anemia work up No results for input(s): VITAMINB12, FOLATE, FERRITIN, TIBC, IRON, RETICCTPCT in the last 72 hours. Urinalysis No results found for: COLORURINE, APPEARANCEUR, LABSPEC, [place], GLUCOSEU, HGBUR, BILIRUBINUR, KETONESUR, PROTEINUR, UROBILINOGEN, NITRITE, LEUKOCYTESUR Sepsis Labs Invalid input(s): PROCALCITONIN,  WBC,  LACTICIDVEN Microbiology No results found for this or any previous visit (from the past 240 hour(s)).   Time coordinating discharge: Over 30 minutes  SIGNED:   Wyvonnia Dusky, MD  [facility] 12/04/2021, 2:08 PM Pager   If 7PM-7AM, please contact night-coverage

## 2021-12-04 NOTE — Progress Notes (Signed)
Patient in SB rate 50s at this time. 12 lead EKG to be obtained then this RN will update Cath lab regarding TEE on schedule. MD Gollan to be made aware by Cath RN.

## 2021-12-04 NOTE — Progress Notes (Signed)
All DC paperwork reviewed with patient and wife at bedside. No questions at this time. Patient belongings packed by patient and taken down at time of DC to car by patient. Patient and wife walked down to car at time of DC. Vitals WDL at this time. Patient to be transported home via [place].

## 2021-12-04 NOTE — Progress Notes (Signed)
Progress Note  Patient Name: John Pineda. Date of Encounter: 12/04/2021  [facility] Cardiologist: Nelva Bush, MD   Subjective    Reports that he feels well, no complaints, no shortness of breath or palpitations Ambulating around the room Telemetry reviewed, converted to normal sinus rhythm 11 PM last night TEE and cardioversion canceled  Inpatient Medications    Scheduled Meds:  apixaban  5 mg Oral BID   bromocriptine  1.25 mg Oral Daily   digoxin  0.25 mg Intravenous Once   digoxin  0.125 mg Oral Daily   gabapentin  600 mg Oral Daily   levothyroxine  100 mcg Oral Q0600   metoprolol succinate  12.5 mg Oral Daily   multivitamin with minerals  1 tablet Oral Daily   pantoprazole  40 mg Oral Daily   simvastatin  20 mg Oral Daily   Continuous Infusions:  PRN Meds: acetaminophen, ondansetron (ZOFRAN) IV   Vital Signs    Vitals:   12/04/21 0052 12/04/21 0508 12/04/21 0727 12/04/21 1202  BP: (!) 86/59 107/77 105/76 102/71  Pulse: (!) 53 (!) 56 70 76  Resp: 20 17 18 18   Temp: (!) 97.3 F (36.3 C) 98.3 F (36.8 C) 98.1 F (36.7 C) 98.2 F (36.8 C)  TempSrc:    Oral  SpO2: 95% 97% 100% 100%  Weight:      Height:        Intake/Output Summary (Last 24 hours) at 12/04/2021 1352 Last data filed at 12/04/2021 2263 Gross per 24 hour  Intake 856.35 ml  Output --  Net 856.35 ml   Last 3 Weights 12/03/2021 12/02/2021 08/19/2020  Weight (lbs) 225 lb 9.6 oz 219 lb 210 lb  Weight (kg) 102.331 kg 99.338 kg 95.255 kg      Telemetry    Normal sinus rhythm, converted from atrial fibrillation 11 PM on December 14- Personally Reviewed  ECG    - Personally Reviewed  Physical Exam   GEN: No acute distress.   Neck: No JVD Cardiac: RRR, no murmurs, rubs, or gallops.  Respiratory: Clear to auscultation bilaterally. GI: Soft, nontender, non-distended  MS: No edema; No deformity. Neuro:  Nonfocal  Psych: Normal affect   Labs    High Sensitivity  Troponin:   Recent Labs  Lab 12/02/21 0518 12/02/21 1509  TROPONINIHS 12 7     Chemistry Recent Labs  Lab 12/02/21 0518 12/03/21 0525 12/04/21 0629  NA 137 137 137  K 4.1 4.2 4.5  CL 105 105 105  CO2 27 26 25   GLUCOSE 104* 103* 103*  BUN 18 20 23   CREATININE 1.03 0.88 0.93  CALCIUM 9.2 8.5* 8.7*  MG 2.5*  --   --   PROT 7.8  --   --   ALBUMIN 4.3  --   --   AST 24  --   --   ALT 23  --   --   ALKPHOS 93  --   --   BILITOT 0.8  --   --   GFRNONAA >60 >60 >60  ANIONGAP 5 6 7     Lipids No results for input(s): CHOL, TRIG, HDL, LABVLDL, LDLCALC, CHOLHDL in the last 168 hours.  Hematology Recent Labs  Lab 12/02/21 0518 12/03/21 0525 12/04/21 0629  WBC 7.4 6.2 5.6  RBC 4.72 4.30 4.42  HGB 14.8 13.5 14.0  HCT 44.1 40.1 41.3  MCV 93.4 93.3 93.4  MCH 31.4 31.4 31.7  MCHC 33.6 33.7 33.9  RDW 13.5 13.9 13.8  PLT 257 213 212   Thyroid  Recent Labs  Lab 12/02/21 0518  TSH 0.557  FREET4 0.89    BNPNo results for input(s): BNP, PROBNP in the last 168 hours.  DDimer No results for input(s): DDIMER in the last 168 hours.   Radiology    ECHOCARDIOGRAM COMPLETE  Result Date: 12/03/2021    ECHOCARDIOGRAM REPORT   Patient Name:   John Pineda. Date of Exam: 12/03/2021 Medical Rec #:  [id number]            Height:       72.0 in Accession #:    [id number]           Weight:       219.0 lb Date of Birth:  15-May-1961             BSA:          2.214 m Patient Age:    60 years             BP:           106/69 mmHg Patient Gender: M                    HR:           108 bpm. Exam Location:  [facility] Procedure: 2D Echo, Color Doppler and Cardiac Doppler Indications:     I48.91 Atrial fibrillation  History:         Patient has no prior history of Echocardiogram examinations.                  Risk Factors:Sleep Apnea.  Sonographer:     Charmayne Sheer Referring Phys:  [id number] IRCVELFY AGBATA Diagnosing Phys: Kate Sable MD  Sonographer Comments: Suboptimal subcostal window. IMPRESSIONS   1. Left ventricular ejection fraction, by estimation, is 35 to 40%. The left ventricle has moderately decreased function. The left ventricle demonstrates global hypokinesis. Left ventricular diastolic parameters are indeterminate.  2. Right ventricular systolic function is low normal. The right ventricular size is normal.  3. Left atrial size was mild to moderately dilated.  4. The mitral valve is normal in structure. No evidence of mitral valve regurgitation.  5. The aortic valve is tricuspid. Aortic valve regurgitation is not visualized.  6. Aortic dilatation noted. There is borderline dilatation of the aortic root, measuring 38 mm. FINDINGS  Left Ventricle: Left ventricular ejection fraction, by estimation, is 35 to 40%. The left ventricle has moderately decreased function. The left ventricle demonstrates global hypokinesis. The left ventricular internal cavity size was normal in size. There is no left ventricular hypertrophy. Left ventricular diastolic parameters are indeterminate. Right Ventricle: The right ventricular size is normal. No increase in right ventricular wall thickness. Right ventricular systolic function is low normal. Left Atrium: Left atrial size was mild to moderately dilated. Right Atrium: Right atrial size was normal in size. Pericardium: There is no evidence of pericardial effusion. Mitral Valve: The mitral valve is normal in structure. No evidence of mitral valve regurgitation. MV peak gradient, 1.8 mmHg. The mean mitral valve gradient is 1.0 mmHg. Tricuspid Valve: The tricuspid valve is normal in structure. Tricuspid valve regurgitation is trivial. Aortic Valve: The aortic valve is tricuspid. Aortic valve regurgitation is not visualized. Aortic valve mean gradient measures 1.0 mmHg. Aortic valve peak gradient measures 2.6 mmHg. Aortic valve area, by VTI measures 4.20 cm. Pulmonic Valve: The pulmonic valve was normal in structure. Pulmonic valve regurgitation is not visualized. Aorta:  Aortic dilatation noted. There is borderline dilatation of the aortic root, measuring 38 mm. Venous: The inferior vena cava was not well visualized. IAS/Shunts: No atrial level shunt detected by color flow Doppler.  LEFT VENTRICLE PLAX 2D LVIDd:         4.67 cm   Diastology LVIDs:         3.80 cm   LV e' medial:    10.30 cm/s LV PW:         1.16 cm   LV E/e' medial:  6.0 LV IVS:        0.82 cm   LV e' lateral:   14.40 cm/s LVOT diam:     2.40 cm   LV E/e' lateral: 4.3 LV SV:         58 LV SV Index:   26 LVOT Area:     4.52 cm  RIGHT VENTRICLE RV Basal diam:  3.93 cm LEFT ATRIUM             Index        RIGHT ATRIUM           Index LA diam:        3.70 cm 1.67 cm/m   RA Area:     20.80 cm LA Vol (A2C):   58.7 ml 26.51 ml/m  RA Volume:   59.90 ml  27.05 ml/m LA Vol (A4C):   91.3 ml 41.23 ml/m LA Biplane Vol: 73.2 ml 33.06 ml/m  AORTIC VALVE                    PULMONIC VALVE AV Area (Vmax):    4.24 cm     PV Vmax:       0.78 m/s AV Area (Vmean):   4.21 cm     PV Vmean:      57.100 cm/s AV Area (VTI):     4.20 cm     PV VTI:        0.154 m AV Vmax:           81.10 cm/s   PV Peak grad:  2.4 mmHg AV Vmean:          56.900 cm/s  PV Mean grad:  1.0 mmHg AV VTI:            0.139 m AV Peak Grad:      2.6 mmHg AV Mean Grad:      1.0 mmHg LVOT Vmax:         76.00 cm/s LVOT Vmean:        53.000 cm/s LVOT VTI:          0.129 m LVOT/AV VTI ratio: 0.93  AORTA Ao Root diam: 3.80 cm MITRAL VALVE MV Area (PHT): 3.11 cm    SHUNTS MV Area VTI:   6.00 cm    Systemic VTI:  0.13 m MV Peak grad:  1.8 mmHg    Systemic Diam: 2.40 cm MV Mean grad:  1.0 mmHg MV Vmax:       0.67 m/s MV Vmean:      45.8 cm/s MV Decel Time: 244 msec MV E velocity: 61.45 cm/s Kate Sable MD Electronically signed by Kate Sable MD Signature Date/Time: 12/03/2021/6:56:49 PM    Final      Patient Profile     John Pineda. is a 60 y.o. male with a history of GERD, HL, Epstein-Barr, pituitary adenoma s/p resection, OSA (not on CPAP),  and hypothyroidism who is being seen  today for the evaluation of rapid afib  Assessment & Plan    Atrial fibrillation with RVR Likely having paroxysmal atrial fibrillation by his history It appears he has converted to normal sinus rhythm on digoxin alone, Metoprolol tartrate was risen but was not given by nursing over the past 24 hours -Echocardiogram reviewed with him, while in atrial fibrillation ejection fraction 35 to 40% -Medication options limited secondary to hypotension -We recommend he continue digoxin 0.125 daily, start metoprolol succinate 12.5 daily in the p.m., Eliquis 5 twice daily  Cardiomyopathy Suspected to be tachycardia mediated in the setting of atrial fibrillation with RVR Continue low-dose digoxin and metoprolol succinate as above No room for additional medication such as spironolactone or Entresto at this time Limited echocardiogram as outpatient.   If he continues to have cardiomyopathy/depressed ejection fraction, potentially could add SGLT2 inhibitor, consider ischemic work-up  GERD On PPI    Discharge instructions discussed with him Long discussion concerning atrial fibrillation, mechanism,/causes, treatment, medication management, risk and benefit of ablation  Total encounter time more than 35 minutes  Greater than 50% was spent in counseling and coordination of care with the patient  For questions or updates, please contact [place] Please consult [email or web address] for contact info under        Signed, Ida Rogue, MD  12/04/2021, 1:52 PM

## 2021-12-04 NOTE — Consult Note (Signed)
John Pineda for IV Heparin Indication: atrial fibrillation  Patient Measurements: Height: 6' (182.9 cm) Weight: 102.3 kg (225 lb 9.6 oz) IBW/kg (Calculated) : 77.6 Heparin Dosing Weight: 97.7 kg  Labs: Recent Labs    12/02/21 0518 12/02/21 1509 12/02/21 2212 12/03/21 0525 12/03/21 1149 12/04/21 0629  HGB 14.8  --   --  13.5  --  14.0  HCT 44.1  --   --  40.1  --  41.3  PLT 257  --   --  213  --  212  APTT  --  30  --   --   --   --   LABPROT  --  13.2  --   --   --   --   INR  --  1.0  --   --   --   --   HEPARINUNFRC  --   --    < > 0.61 0.68 0.56  CREATININE 1.03  --   --  0.88  --   --   TROPONINIHS 12 7  --   --   --   --    < > = values in this interval not displayed.     Estimated Creatinine Clearance: 109.1 mL/min (by C-G formula based on SCr of 0.88 mg/dL).   Medical History: Past Medical History:  Diagnosis Date   Esophageal stricture    GERD (gastroesophageal reflux disease)    Gynecomastia, male    History of Epstein-Barr virus infection 2008   History of secondary hypogonadism    Hypothyroidism    Pituitary adenoma ([place]) 2011   S/P resection   Plantar fasciitis    Pre-diabetes    RLS (restless legs syndrome)    Sleep apnea    No CPAP    Medications:  No anticoagulation prior to admission per chart review  Assessment: Patient is a 60 y/o M with medical history as above who presented with palpitations and is now being admitted with new-onset Afib with RVR. Pharmacy consulted to initiate heparin infusion for Afib. Baseline CBC acceptable  Goal of Therapy:  Heparin level 0.3-0.7 units/ml Monitor platelets by anticoagulation protocol: Yes  12/13 2212 HL 0.28, subtherapeutic 12/14 0525 HL 0.61, therapeutic x 1 12/14 1149 HL 0.68, therapeutic x 2  12/15 0629 HL 0.56, therapeutic x 3    Plan:  --Continue heparin infusion at 1650 units/hr --Recheck HL with AM labs --Daily CBC per protocol while on IV  heparin  Renda Rolls, PharmD, [facility] 12/04/2021 7:04 AM

## 2021-12-04 NOTE — Clinical Social Work Note (Signed)
°  Transition of Care [facility]) Screening Note   Patient Details  Name: John Pineda. Date of Birth: 05-21-1961   Transition of Care [facility]) CM/SW Contact:    Eileen Stanford, LCSW Phone Number: 12/04/2021, 9:07 AM    Transition of Care Department [facility]) has reviewed patient and no TOC needs have been identified at this time. We will continue to monitor patient advancement through interdisciplinary progression rounds. If new patient transition needs arise, please place a TOC consult.

## 2021-12-23 DIAGNOSIS — I480 Paroxysmal atrial fibrillation: Secondary | ICD-10-CM | POA: Insufficient documentation

## 2022-02-02 DIAGNOSIS — G4733 Obstructive sleep apnea (adult) (pediatric): Secondary | ICD-10-CM | POA: Diagnosis present

## 2022-02-24 IMAGING — DX DG CHEST 1V PORT
1 series · 1 of 1 positions shown · non-contrast
Comparison: None.

CLINICAL DATA: Chest pain

EXAM:
PORTABLE CHEST 1 VIEW

[chest ap]
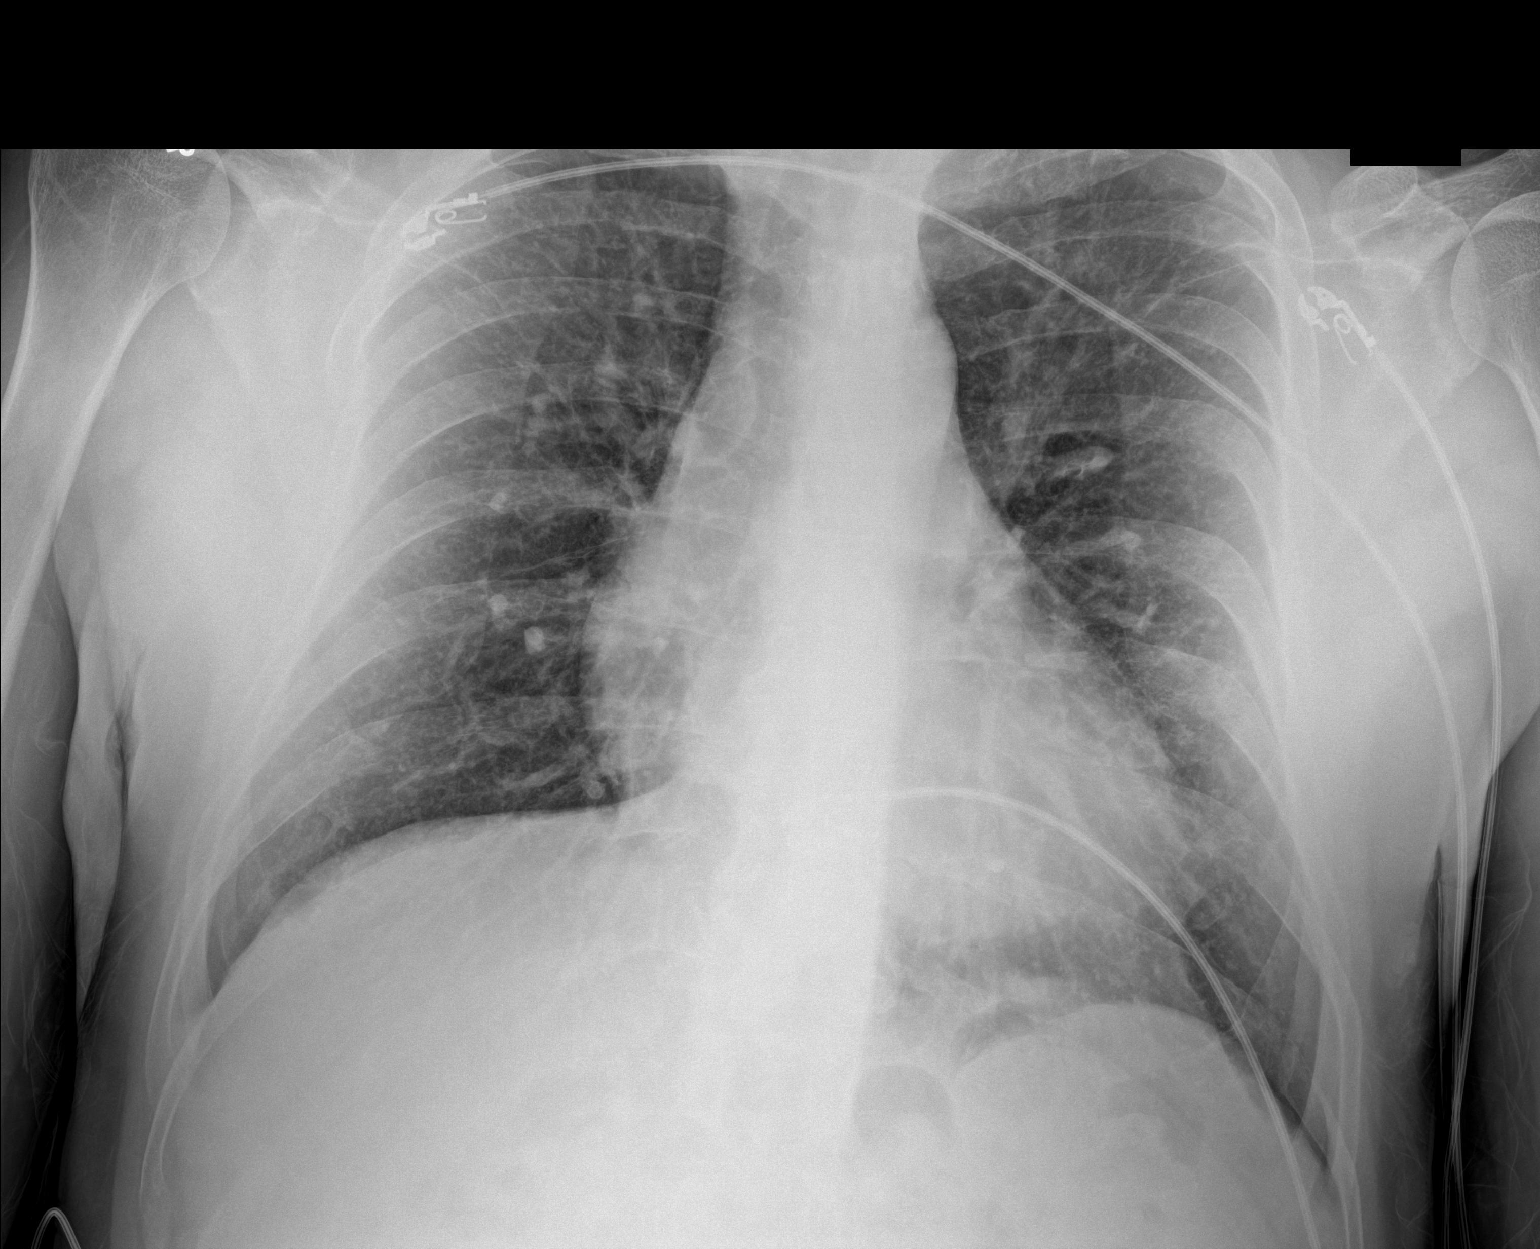

[1 of 1 positions shown; findings below may reference images not displayed]

FINDINGS: The heart size and mediastinal contours are within normal limits.
Both lungs are clear. The visualized skeletal structures are
unremarkable.
IMPRESSION: Negative portable chest.

## 2022-05-04 ENCOUNTER — Encounter: Payer: Self-pay | Admitting: Emergency Medicine

## 2022-05-04 ENCOUNTER — Emergency Department
Admission: EM | Admit: 2022-05-04 | Discharge: 2022-05-04 | Discharge status: Against Medical Advice | Payer: BC Managed Care – PPO | Attending: Emergency Medicine | Admitting: Emergency Medicine

## 2022-05-04 DIAGNOSIS — H5789 Other specified disorders of eye and adnexa: Secondary | ICD-10-CM | POA: Insufficient documentation

## 2022-05-04 DIAGNOSIS — Z7901 Long term (current) use of anticoagulants: Secondary | ICD-10-CM | POA: Diagnosis not present

## 2022-05-04 DIAGNOSIS — I4891 Unspecified atrial fibrillation: Secondary | ICD-10-CM | POA: Diagnosis not present

## 2022-05-04 DIAGNOSIS — Z5321 Procedure and treatment not carried out due to patient leaving prior to being seen by health care provider: Secondary | ICD-10-CM | POA: Insufficient documentation

## 2022-05-04 NOTE — ED Triage Notes (Addendum)
Pt presents via POV with right eye redness that happened tonight. Notable redness to the inner portion of his sclera - notes taking Eliquis daily for afib. Of note, pt had surgery on the right eye back 2020 for retinal issues at [facility] (right eye pars plana vitrectomy) Denies pain, vision changes, falls, or trauma to his eye.  ?

## 2022-05-04 NOTE — ED Notes (Signed)
Visual Acuity with correctional lens.  ? ?L eye - 20/30 ?R eye - 2030 ?Both eyes - 20/30 ?

## 2022-06-04 ENCOUNTER — Other Ambulatory Visit
Admission: RE | Admit: 2022-06-04 | Discharge: 2022-06-04 | Disposition: A | Discharge status: Home / Self Care | Payer: BC Managed Care – PPO | Source: Ambulatory Visit | Attending: Physician Assistant | Admitting: Physician Assistant

## 2022-06-04 DIAGNOSIS — Z79899 Other long term (current) drug therapy: Secondary | ICD-10-CM | POA: Diagnosis present

## 2022-06-04 LAB — DIGOXIN LEVEL: Digoxin Level: 1 ng/mL (ref 0.8–2.0)

## 2023-01-25 DIAGNOSIS — Z9889 Other specified postprocedural states: Secondary | ICD-10-CM | POA: Insufficient documentation

## 2023-06-12 ENCOUNTER — Other Ambulatory Visit: Payer: Self-pay

## 2023-06-12 ENCOUNTER — Observation Stay
Admission: EM | Admit: 2023-06-12 | Discharge: 2023-06-13 | Disposition: A | Discharge status: Home / Self Care | Payer: BC Managed Care – PPO | Attending: Internal Medicine | Admitting: Internal Medicine

## 2023-06-12 ENCOUNTER — Emergency Department: Payer: BC Managed Care – PPO

## 2023-06-12 DIAGNOSIS — Z79899 Other long term (current) drug therapy: Secondary | ICD-10-CM | POA: Diagnosis not present

## 2023-06-12 DIAGNOSIS — G4733 Obstructive sleep apnea (adult) (pediatric): Secondary | ICD-10-CM | POA: Diagnosis present

## 2023-06-12 DIAGNOSIS — J9601 Acute respiratory failure with hypoxia: Secondary | ICD-10-CM | POA: Diagnosis not present

## 2023-06-12 DIAGNOSIS — J9811 Atelectasis: Secondary | ICD-10-CM | POA: Diagnosis not present

## 2023-06-12 DIAGNOSIS — R0602 Shortness of breath: Secondary | ICD-10-CM | POA: Diagnosis present

## 2023-06-12 DIAGNOSIS — R0789 Other chest pain: Principal | ICD-10-CM | POA: Insufficient documentation

## 2023-06-12 DIAGNOSIS — R911 Solitary pulmonary nodule: Secondary | ICD-10-CM | POA: Insufficient documentation

## 2023-06-12 DIAGNOSIS — Z86018 Personal history of other benign neoplasm: Secondary | ICD-10-CM | POA: Insufficient documentation

## 2023-06-12 DIAGNOSIS — I480 Paroxysmal atrial fibrillation: Secondary | ICD-10-CM | POA: Insufficient documentation

## 2023-06-12 DIAGNOSIS — R079 Chest pain, unspecified: Secondary | ICD-10-CM | POA: Diagnosis present

## 2023-06-12 DIAGNOSIS — K219 Gastro-esophageal reflux disease without esophagitis: Secondary | ICD-10-CM | POA: Diagnosis present

## 2023-06-12 DIAGNOSIS — E039 Hypothyroidism, unspecified: Secondary | ICD-10-CM | POA: Insufficient documentation

## 2023-06-12 DIAGNOSIS — E038 Other specified hypothyroidism: Secondary | ICD-10-CM | POA: Diagnosis present

## 2023-06-12 DIAGNOSIS — R5383 Other fatigue: Secondary | ICD-10-CM

## 2023-06-12 DIAGNOSIS — R7989 Other specified abnormal findings of blood chemistry: Secondary | ICD-10-CM | POA: Insufficient documentation

## 2023-06-12 DIAGNOSIS — R001 Bradycardia, unspecified: Secondary | ICD-10-CM | POA: Diagnosis present

## 2023-06-12 DIAGNOSIS — Z7901 Long term (current) use of anticoagulants: Secondary | ICD-10-CM | POA: Diagnosis not present

## 2023-06-12 DIAGNOSIS — E871 Hypo-osmolality and hyponatremia: Secondary | ICD-10-CM | POA: Insufficient documentation

## 2023-06-12 DIAGNOSIS — R0609 Other forms of dyspnea: Principal | ICD-10-CM

## 2023-06-12 DIAGNOSIS — I4891 Unspecified atrial fibrillation: Secondary | ICD-10-CM | POA: Diagnosis present

## 2023-06-12 LAB — CBC
HCT: 42.2 % (ref 39.0–52.0)
Hemoglobin: 13.9 g/dL (ref 13.0–17.0)
MCH: 30.2 pg (ref 26.0–34.0)
MCHC: 32.9 g/dL (ref 30.0–36.0)
MCV: 91.7 fL (ref 80.0–100.0)
Platelets: 214 10*3/uL (ref 150–400)
RBC: 4.6 MIL/uL (ref 4.22–5.81)
RDW: 13.2 % (ref 11.5–15.5)
WBC: 5.4 10*3/uL (ref 4.0–10.5)
nRBC: 0 % (ref 0.0–0.2)

## 2023-06-12 LAB — BRAIN NATRIURETIC PEPTIDE: B Natriuretic Peptide: 52.8 pg/mL (ref 0.0–100.0)

## 2023-06-12 LAB — BASIC METABOLIC PANEL
Anion gap: 7 (ref 5–15)
BUN: 23 mg/dL (ref 8–23)
CO2: 23 mmol/L (ref 22–32)
Calcium: 8.6 mg/dL — ABNORMAL LOW (ref 8.9–10.3)
Chloride: 101 mmol/L (ref 98–111)
Creatinine, Ser: 0.96 mg/dL (ref 0.61–1.24)
GFR, Estimated: >60 mL/min (ref >60)
Glucose, Bld: 98 mg/dL (ref 70–99)
Potassium: 3.7 mmol/L (ref 3.5–5.1)
Sodium: 131 mmol/L — ABNORMAL LOW (ref 135–145)

## 2023-06-12 LAB — TSH: TSH: 0.131 u[IU]/mL — ABNORMAL LOW (ref 0.350–4.500)

## 2023-06-12 LAB — TROPONIN I (HIGH SENSITIVITY)
Troponin I (High Sensitivity): 7 ng/L (ref <18)
Troponin I (High Sensitivity): 7 ng/L (ref <18)

## 2023-06-12 NOTE — H&P (Addendum)
History and Physical    Patient: John Pineda. ZHY:[id number] DOB: [birth year] DOA: 06/12/2023 DOS: the patient was seen and examined on 06/13/2023 PCP: Kandyce Rud, MD  Patient coming from: Home  Chief Complaint:  Chief Complaint  Patient presents with   Chest Pain    HPI: John Pineda. is a 62 y.o. male with medical history significant for obstructive sleep apnea, hyperlipidemia, atrial fibrillation status post ablation in 2023 last seen by [facility] cardiology in April 2024 coming with chest pain  Since April 2024 chart review shows phone message about patient having atrial fibrillation where his heart rate was 124 on his Fitbit and patient taking 6.25 mg of metoprolol.  Compliant with his Eliquis.  Patient was traveling to [place] for hiking trip at that time.  Has an upcoming cardiology appointment on July 13, 2023.  April provider visit notes that patient had gained 20 pounds since the ablation procedure.  Patient was active with hiking splitting his own firewood.  No reports of syncope dizziness lower extremity edema orthopnea chest pain pressure or discomfort at rest or at exertion. Patient today denies any shortness of breath is progressively getting worse with ambulation.  In the emergency room patient noted to be hypoxic with O2 sats of 88% on room air during exertion however during rest his O2 sats are within normal limits. Vitals initially show patient's stable pressure 01/15/1986 afebrile.  CT head requested and ordered and pending.Hyponatremia of 131 LFTs added and pending.  BNP of 52.8, troponin negative x 2, limits, chest x-ray negative for any cardiopulmonary disease.Echocardiography (01/13/2022) - preserved left ventricular systolic function with left ventricular ejection fraction >55%, no significant valvular disease, moderate left atrial enlargement ,  >>"NORMAL LEFT VENTRICULAR SYSTOLIC FUNCTION NORMAL RIGHT VENTRICULAR SYSTOLIC FUNCTION MILD VALVULAR REGURGITATION (See  above) NO VALVULAR STENOSIS ESTIMATED LVEF >55% Aortic: NORMAL GRADIENTS AOV: MILDLY THICKENED, FULLY MOBILE LEAFLETS; SCLEROSIS WITH NO EVIDENCE OF STENOSIS Mitral: TRIVIAL MR Tricuspid: TRIVIAL TR Pulmonic: MILD PI MILDLY DILATED ASCENDING AORTA MEASURING 3.5cm MODERATE LAE "<<   Review of Systems: Review of Systems  Respiratory:  Positive for shortness of breath.   Cardiovascular:  Positive for palpitations.  All other systems reviewed and are negative.    Past Medical History:  Diagnosis Date   Esophageal stricture    GERD (gastroesophageal reflux disease)    Gynecomastia, male    History of Epstein-Barr virus infection 2008   History of secondary hypogonadism    Hypothyroidism    Pituitary adenoma (HCC) 2011   S/P resection   Plantar fasciitis    Pre-diabetes    RLS (restless legs syndrome)    Shortness of breath/respiratory failure with hypoxia 12/04/2021   Sleep apnea    No CPAP   Past Surgical History:  Procedure Laterality Date   BREAST BIOPSY Right    BUNIONECTOMY Right 12/2008   Great toe   COLONOSCOPY WITH ESOPHAGOGASTRODUODENOSCOPY (EGD)     2008, 2018   FRACTURE SURGERY Right    Collarbone   PARS PLANA VITRECTOMY Right 02/07/2019   PITUITARY EXCISION  2011   UVULOPALATOPHARYNGOPLASTY     Social History:  reports that he has never smoked. He has never used smokeless tobacco. He reports current alcohol use of about 2.0 standard drinks of alcohol per week. He reports that he does not use drugs.  No Known Allergies  History reviewed. No pertinent family history.  Prior to Admission medications   Medication Sig Start Date End Date Taking? Authorizing Provider  apixaban (ELIQUIS) 5 MG TABS tablet Take 1 tablet (5 mg total) by mouth 2 (two) times daily. 12/04/21 01/03/22  Charise Killian, MD  bromocriptine (PARLODEL) 2.5 MG tablet Take 1.25 mg by mouth daily.    [provider]  digoxin (LANOXIN) 0.125 MG tablet Take 1 tablet (0.125 mg  total) by mouth daily. 12/05/21 01/04/22  Charise Killian, MD  gabapentin (NEURONTIN) 300 MG capsule Take 600 mg by mouth daily.    [provider]  levothyroxine (SYNTHROID) 100 MCG tablet Take 100 mcg by mouth daily before breakfast.    [provider]  metoprolol succinate (TOPROL-XL) 25 MG 24 hr tablet Take 0.5 tablets (12.5 mg total) by mouth every evening. 12/04/21 01/03/22  Charise Killian, MD  Multiple Vitamin (MULTIVITAMIN) tablet Take 1 tablet by mouth daily.    [provider]  omeprazole (PRILOSEC) 20 MG capsule Take 20 mg by mouth daily.    [provider]  simvastatin (ZOCOR) 20 MG tablet Take 20 mg by mouth daily.    [provider]  testosterone cypionate (DEPOTESTOTERONE CYPIONATE) 100 MG/ML injection Inject 200 mg into the muscle every 14 (fourteen) days. For IM use only    [provider]     Vitals:   06/12/23 2118 06/12/23 2130 06/12/23 2200 06/12/23 2230  BP: (!) 124/99 115/87 (!) 128/98 116/86  Pulse: (!) 49 (!) 52 (!) 53 (!) 55  Resp: 19 18 18 19   Temp:      TempSrc:      SpO2: 90% 96% 94% 96%  Weight:      Height:       Physical Exam Vitals and nursing note reviewed.  Constitutional:      General: He is not in acute distress. HENT:     Head: Normocephalic and atraumatic.     Right Ear: Hearing normal.     Left Ear: Hearing normal.     Nose: Nose normal. No nasal deformity.     Mouth/Throat:     Lips: Pink.     Tongue: No lesions.     Pharynx: Oropharynx is clear.  Eyes:     General: Lids are normal.     Extraocular Movements: Extraocular movements intact.  Cardiovascular:     Rate and Rhythm: Normal rate and regular rhythm.     Heart sounds: Normal heart sounds.  Pulmonary:     Effort: Pulmonary effort is normal.     Breath sounds: Normal breath sounds.  Abdominal:     General: Bowel sounds are normal. There is no distension.     Palpations: Abdomen is soft. There is no mass.      Tenderness: There is no abdominal tenderness.  Musculoskeletal:     Right lower leg: No edema.     Left lower leg: No edema.  Skin:    General: Skin is warm.  Neurological:     General: No focal deficit present.     Mental Status: He is alert and oriented to person, place, and time.     Cranial Nerves: Cranial nerves 2-12 are intact.  Psychiatric:        Attention and Perception: Attention normal.        Mood and Affect: Mood normal.        Speech: Speech normal.        Behavior: Behavior normal. Behavior is cooperative.      Labs on Admission: I have personally reviewed following labs and imaging studies  CBC: Recent  Labs  Lab 06/12/23 1939  WBC 5.4  HGB 13.9  HCT 42.2  MCV 91.7  PLT 214   Basic Metabolic Panel: Recent Labs  Lab 06/12/23 1939 06/12/23 2112  NA 131*  --   K 3.7  --   CL 101  --   CO2 23  --   GLUCOSE 98  --   BUN 23  --   CREATININE 0.96  --   CALCIUM 8.6*  --   MG  --  2.4   GFR: Estimated Creatinine Clearance: 97.7 mL/min (by C-G formula based on SCr of 0.96 mg/dL). Liver Function Tests: No results for input(s): "AST", "ALT", "ALKPHOS", "BILITOT", "PROT", "ALBUMIN" in the last 168 hours. No results for input(s): "LIPASE", "AMYLASE" in the last 168 hours. No results for input(s): "AMMONIA" in the last 168 hours. Coagulation Profile: No results for input(s): "INR", "PROTIME" in the last 168 hours. Cardiac Enzymes: No results for input(s): "CKTOTAL", "CKMB", "CKMBINDEX", "TROPONINI" in the last 168 hours. BNP (last 3 results) No results for input(s): "PROBNP" in the last 8760 hours. HbA1C: No results for input(s): "HGBA1C" in the last 72 hours. CBG: No results for input(s): "GLUCAP" in the last 168 hours. Lipid Profile: No results for input(s): "CHOL", "HDL", "LDLCALC", "TRIG", "CHOLHDL", "LDLDIRECT" in the last 72 hours. Thyroid Function Tests: Recent Labs    06/12/23 2112  TSH 0.131*  FREET4 0.83   Anemia Panel: No results for  input(s): "VITAMINB12", "FOLATE", "FERRITIN", "TIBC", "IRON", "RETICCTPCT" in the last 72 hours. Urine analysis: No results found for: "COLORURINE", "APPEARANCEUR", "LABSPEC", "PHURINE", "GLUCOSEU", "HGBUR", "BILIRUBINUR", "KETONESUR", "PROTEINUR", "UROBILINOGEN", "NITRITE", "LEUKOCYTESUR"  Radiological Exams on Admission: DG Chest 2 View  Result Date: 06/12/2023 CLINICAL DATA:  CP EXAM: CHEST - 2 VIEW COMPARISON:  Chest x-ray 12/02/2021 FINDINGS: The heart and mediastinal contours are within normal limits. No focal consolidation. No pulmonary edema. No pleural effusion. No pneumothorax. No acute osseous abnormality. Chronic vertebral body height loss of the midthoracic vertebral bodies with associated exaggerated kyphosis. IMPRESSION: No active cardiopulmonary disease. Electronically Signed   By: Tish Frederickson M.D.   On: 06/12/2023 20:01     Data Reviewed: Relevant notes from primary care and specialist visits, past discharge summaries as available in EHR, including Care Everywhere. Prior diagnostic testing as pertinent to current admission diagnoses Updated medications and problem lists for reconciliation ED course, including vitals, labs, imaging, treatment and response to treatment Triage notes, nursing and pharmacy notes and ED provider's notes Notable results as noted in HPI Assessment and Plan: * Chest pain Pt  with chest pain atypical and sob and DOE. Pt has never had heart cath suspect this may be unstable angina.  We will cycle troponin and follow  Cardiology consulted.    Shortness of breath/respiratory failure with hypoxia Patient's shortness of breath and hypoxia presented concerning for new onset CHF. VTE less likely as this patient has been compliant with his anticoagulation therapy. CTA chest pending. Less likely differential pericardial effusion, pericarditis, aneurysm, dissection. Diuretic therapy as deemed appropriate currently not orders until results of  CTA.  Sinus bradycardia D/d include ETOH related Sinus node dysfunction/ heart failure. We will obtain TFT and admit to step down event he may need to be paced overnight.   Secondary hypothyroidism Continue home regimen with levothyroxine 100 mcg.   OSA (obstructive sleep apnea) CPAP per home settings.  Dilated cardiomyopathy (HCC) Unclear if this is related to patient's alcohol use.  Patient has been followed closely by cardiology. Will request cardiology  consult [facility] Dr. Juliann Pares message sent.  Atrial fibrillation (HCC) Currently in sinus rhythm. CHA2DS2/VAS Stroke Risk Points  Current as of 10 minutes ago     0 >= 2 Points: High Risk  1 - 1.99 Points: Medium Risk  0 Points: Low Risk    Last Change: N/A      Details    This score determines the patient's risk of having a stroke if the  patient has atrial fibrillation.       Points Metrics  0 Has Congestive Heart Failure:  No    Current as of 10 minutes ago  0 Has Vascular Disease:  No    Current as of 10 minutes ago  0 Has Hypertension:  No    Current as of 10 minutes ago  0 Age:  72    Current as of 10 minutes ago  0 Has Diabetes:  No    Current as of 10 minutes ago  0 Had Stroke:  No  Had TIA:  No  Had Thromboembolism:  No    Current as of 10 minutes ago  0 Male:  No    Current as of 10 minutes ago  Continue Eliquis. Telemetry monitoring.         GERD (gastroesophageal reflux disease) Continue with PPI therapy.     DVT prophylaxis:  Eliquis.   Consults:  Cardiology: Dr.Callwood.   Advance Care Planning:    Code Status: Full Code   Family Communication:  None    Disposition Plan:  Back to previous home environment  Severity of Illness: The appropriate patient status for this patient is OBSERVATION. Observation status is judged to be reasonable and necessary in order to provide the required intensity of service to ensure the patient's safety. The patient's presenting symptoms,  physical exam findings, and initial radiographic and laboratory data in the context of their medical condition is felt to place them at decreased risk for further clinical deterioration. Furthermore, it is anticipated that the patient will be medically stable for discharge from the hospital within 2 midnights of admission.   Author: Gertha Calkin, MD 06/13/2023 1:57 AM  For on call review [email or web address].

## 2023-06-12 NOTE — ED Triage Notes (Signed)
Pt arrives via POV with CC of L sided chest pain that began this morning. Hx of afib w/ablation. Denies nausea/dizziness. Reports SOB with exertion. Appears to be in NAD at this time.

## 2023-06-12 NOTE — H&P (Incomplete)
History and Physical    Patient: John Pineda. ZOX:[id number] DOB: 04/07/1961 DOA: 06/12/2023 DOS: the patient was seen and examined on 06/12/2023 PCP: Kandyce Rud, MD  Patient coming from: {Point_of_Origin:[address]}  Chief Complaint:  Chief Complaint  Patient presents with  . Chest Pain    HPI: John Pineda. is a 62 y.o. male with medical history significant for ***   Review of Systems: ROS   Past Medical History:  Diagnosis Date  . Esophageal stricture   . GERD (gastroesophageal reflux disease)   . Gynecomastia, male   . History of Epstein-Barr virus infection 2008  . History of secondary hypogonadism   . Hypothyroidism   . Pituitary adenoma (HCC) 2011   S/P resection  . Plantar fasciitis   . Pre-diabetes   . RLS (restless legs syndrome)   . Sleep apnea    No CPAP   Past Surgical History:  Procedure Laterality Date  . BREAST BIOPSY Right   . BUNIONECTOMY Right 12/2008   Great toe  . COLONOSCOPY WITH ESOPHAGOGASTRODUODENOSCOPY (EGD)     2008, 2018  . FRACTURE SURGERY Right    Collarbone  . PARS PLANA VITRECTOMY Right 02/07/2019  . PITUITARY EXCISION  2011  . UVULOPALATOPHARYNGOPLASTY     Social History:  reports that he has never smoked. He has never used smokeless tobacco. He reports current alcohol use of about 2.0 standard drinks of alcohol per week. He reports that he does not use drugs.  No Known Allergies  History reviewed. No pertinent family history.  Prior to Admission medications   Medication Sig Start Date End Date Taking? Authorizing Provider  apixaban (ELIQUIS) 5 MG TABS tablet Take 1 tablet (5 mg total) by mouth 2 (two) times daily. 12/04/21 01/03/22  Charise Killian, MD  bromocriptine (PARLODEL) 2.5 MG tablet Take 1.25 mg by mouth daily.    [provider]  digoxin (LANOXIN) 0.125 MG tablet Take 1 tablet (0.125 mg total) by mouth daily. 12/05/21 01/04/22  Charise Killian, MD  gabapentin (NEURONTIN) 300 MG capsule  Take 600 mg by mouth daily.    [provider]  levothyroxine (SYNTHROID) 100 MCG tablet Take 100 mcg by mouth daily before breakfast.    [provider]  metoprolol succinate (TOPROL-XL) 25 MG 24 hr tablet Take 0.5 tablets (12.5 mg total) by mouth every evening. 12/04/21 01/03/22  Charise Killian, MD  Multiple Vitamin (MULTIVITAMIN) tablet Take 1 tablet by mouth daily.    [provider]  omeprazole (PRILOSEC) 20 MG capsule Take 20 mg by mouth daily.    [provider]  simvastatin (ZOCOR) 20 MG tablet Take 20 mg by mouth daily.    [provider]  testosterone cypionate (DEPOTESTOTERONE CYPIONATE) 100 MG/ML injection Inject 200 mg into the muscle every 14 (fourteen) days. For IM use only    [provider]     Vitals:   06/12/23 2118 06/12/23 2130 06/12/23 2200 06/12/23 2230  BP: (!) 124/99 115/87 (!) 128/98 116/86  Pulse: (!) 49 (!) 52 (!) 53 (!) 55  Resp: 19 18 18 19   Temp:      TempSrc:      SpO2: 90% 96% 94% 96%  Weight:      Height:       Physical Exam   Labs on Admission: I have personally reviewed following labs and imaging studies  CBC: Recent Labs  Lab 06/12/23 1939  WBC 5.4  HGB 13.9  HCT 42.2  MCV 91.7  PLT 214   Basic Metabolic Panel: Recent Labs  Lab 06/12/23 1939  NA 131*  K 3.7  CL 101  CO2 23  GLUCOSE 98  BUN 23  CREATININE 0.96  CALCIUM 8.6*   GFR: Estimated Creatinine Clearance: 97.7 mL/min (by C-G formula based on SCr of 0.96 mg/dL). Liver Function Tests: No results for input(s): "AST", "ALT", "ALKPHOS", "BILITOT", "PROT", "ALBUMIN" in the last 168 hours. No results for input(s): "LIPASE", "AMYLASE" in the last 168 hours. No results for input(s): "AMMONIA" in the last 168 hours. Coagulation Profile: No results for input(s): "INR", "PROTIME" in the last 168 hours. Cardiac Enzymes: No results for input(s): "CKTOTAL", "CKMB", "CKMBINDEX", "TROPONINI" in the last 168 hours. BNP (last 3  results) No results for input(s): "PROBNP" in the last 8760 hours. HbA1C: No results for input(s): "HGBA1C" in the last 72 hours. CBG: No results for input(s): "GLUCAP" in the last 168 hours. Lipid Profile: No results for input(s): "CHOL", "HDL", "LDLCALC", "TRIG", "CHOLHDL", "LDLDIRECT" in the last 72 hours. Thyroid Function Tests: Recent Labs    06/12/23 2112  TSH 0.131*   Anemia Panel: No results for input(s): "VITAMINB12", "FOLATE", "FERRITIN", "TIBC", "IRON", "RETICCTPCT" in the last 72 hours. Urine analysis: No results found for: "COLORURINE", "APPEARANCEUR", "LABSPEC", "PHURINE", "GLUCOSEU", "HGBUR", "BILIRUBINUR", "KETONESUR", "PROTEINUR", "UROBILINOGEN", "NITRITE", "LEUKOCYTESUR"  Radiological Exams on Admission: DG Chest 2 View  Result Date: 06/12/2023 CLINICAL DATA:  CP EXAM: CHEST - 2 VIEW COMPARISON:  Chest x-ray 12/02/2021 FINDINGS: The heart and mediastinal contours are within normal limits. No focal consolidation. No pulmonary edema. No pleural effusion. No pneumothorax. No acute osseous abnormality. Chronic vertebral body height loss of the midthoracic vertebral bodies with associated exaggerated kyphosis. IMPRESSION: No active cardiopulmonary disease. Electronically Signed   By: Tish Frederickson M.D.   On: 06/12/2023 20:01     Data Reviewed: Relevant notes from primary care and specialist visits, past discharge summaries as available in EHR, including Care Everywhere. Prior diagnostic testing as pertinent to current admission diagnoses Updated medications and problem lists for reconciliation ED course, including vitals, labs, imaging, treatment and response to treatment Triage notes, nursing and pharmacy notes and ED provider's notes Notable results as noted in HPI Assessment and Plan: No notes have been filed under this hospital service. Service: Hospitalist       DVT prophylaxis:  ***  Consults:  ***  Advance Care Planning:    Code Status: Prior    Family Communication:  ***  Disposition Plan:  Back to previous home environment  Severity of Illness: {Observation/Inpatient:21159}  Author: Gertha Calkin, MD 06/12/2023 11:54 PM  For on call review [email or web address].

## 2023-06-12 NOTE — ED Notes (Signed)
ED Provider at bedside consulting with patient and family

## 2023-06-12 NOTE — ED Notes (Addendum)
Oxygen 88-92% while ambulating in hall.

## 2023-06-13 ENCOUNTER — Encounter: Payer: Self-pay | Admitting: Internal Medicine

## 2023-06-13 ENCOUNTER — Inpatient Hospital Stay: Payer: BC Managed Care – PPO

## 2023-06-13 ENCOUNTER — Emergency Department: Payer: BC Managed Care – PPO

## 2023-06-13 DIAGNOSIS — E871 Hypo-osmolality and hyponatremia: Secondary | ICD-10-CM | POA: Insufficient documentation

## 2023-06-13 DIAGNOSIS — I480 Paroxysmal atrial fibrillation: Secondary | ICD-10-CM

## 2023-06-13 DIAGNOSIS — G4733 Obstructive sleep apnea (adult) (pediatric): Secondary | ICD-10-CM

## 2023-06-13 DIAGNOSIS — R7989 Other specified abnormal findings of blood chemistry: Secondary | ICD-10-CM | POA: Insufficient documentation

## 2023-06-13 DIAGNOSIS — J9601 Acute respiratory failure with hypoxia: Secondary | ICD-10-CM

## 2023-06-13 DIAGNOSIS — R0602 Shortness of breath: Secondary | ICD-10-CM | POA: Insufficient documentation

## 2023-06-13 DIAGNOSIS — R0789 Other chest pain: Secondary | ICD-10-CM

## 2023-06-13 DIAGNOSIS — J9811 Atelectasis: Secondary | ICD-10-CM | POA: Insufficient documentation

## 2023-06-13 DIAGNOSIS — R911 Solitary pulmonary nodule: Secondary | ICD-10-CM

## 2023-06-13 DIAGNOSIS — R001 Bradycardia, unspecified: Secondary | ICD-10-CM | POA: Diagnosis present

## 2023-06-13 DIAGNOSIS — K219 Gastro-esophageal reflux disease without esophagitis: Secondary | ICD-10-CM

## 2023-06-13 DIAGNOSIS — E038 Other specified hypothyroidism: Secondary | ICD-10-CM

## 2023-06-13 DIAGNOSIS — R079 Chest pain, unspecified: Secondary | ICD-10-CM | POA: Diagnosis present

## 2023-06-13 LAB — T4, FREE: Free T4: 0.83 ng/dL (ref 0.61–1.12)

## 2023-06-13 LAB — BASIC METABOLIC PANEL
Anion gap: 7 (ref 5–15)
BUN: 21 mg/dL (ref 8–23)
CO2: 24 mmol/L (ref 22–32)
Calcium: 8.9 mg/dL (ref 8.9–10.3)
Chloride: 105 mmol/L (ref 98–111)
Creatinine, Ser: 0.9 mg/dL (ref 0.61–1.24)
GFR, Estimated: >60 mL/min (ref >60)
Glucose, Bld: 87 mg/dL (ref 70–99)
Potassium: 4.1 mmol/L (ref 3.5–5.1)
Sodium: 136 mmol/L (ref 135–145)

## 2023-06-13 LAB — HIV ANTIBODY (ROUTINE TESTING W REFLEX): HIV Screen 4th Generation wRfx: NONREACTIVE

## 2023-06-13 LAB — ETHANOL: Alcohol, Ethyl (B): <10 mg/dL (ref <10)

## 2023-06-13 LAB — MAGNESIUM: Magnesium: 2.4 mg/dL (ref 1.7–2.4)

## 2023-06-13 LAB — DIGOXIN LEVEL: Digoxin Level: <0.2 ng/mL — ABNORMAL LOW (ref 0.8–2.0)

## 2023-06-13 MED ORDER — GABAPENTIN 300 MG PO CAPS: 600.0000 mg | ORAL_CAPSULE | Freq: Every day | ORAL | Status: DC

## 2023-06-13 MED ORDER — ALBUTEROL SULFATE HFA 108 (90 BASE) MCG/ACT IN AERS: 2.0000 | INHALATION_SPRAY | Freq: Four times a day (QID) | RESPIRATORY_TRACT | 0 refills | Status: AC | PRN

## 2023-06-13 MED ORDER — SODIUM CHLORIDE 0.9% FLUSH
3.0000 mL | Freq: Two times a day (BID) | INTRAVENOUS | Status: DC
  Administered 2023-06-13 (×2): 3 mL via INTRAVENOUS

## 2023-06-13 MED ORDER — APIXABAN 5 MG PO TABS
5.0000 mg | ORAL_TABLET | Freq: Two times a day (BID) | ORAL | Status: DC
  Administered 2023-06-13 (×2): 5 mg via ORAL
  Filled 2023-06-13 (×2): qty 1

## 2023-06-13 MED ORDER — HYDRALAZINE HCL 20 MG/ML IJ SOLN: 5.0000 mg | INTRAMUSCULAR | Status: DC | PRN

## 2023-06-13 MED ORDER — ACETAMINOPHEN 325 MG PO TABS: 650.0000 mg | ORAL_TABLET | Freq: Four times a day (QID) | ORAL | Status: DC | PRN

## 2023-06-13 MED ORDER — ACETAMINOPHEN 650 MG RE SUPP: 650.0000 mg | Freq: Four times a day (QID) | RECTAL | Status: DC | PRN

## 2023-06-13 MED ORDER — THIAMINE MONONITRATE 100 MG PO TABS
100.0000 mg | ORAL_TABLET | Freq: Every day | ORAL | Status: DC
  Administered 2023-06-13: 100 mg via ORAL
  Filled 2023-06-13: qty 1

## 2023-06-13 MED ORDER — BUDESONIDE-FORMOTEROL FUMARATE 80-4.5 MCG/ACT IN AERO: 2.0000 | INHALATION_SPRAY | Freq: Two times a day (BID) | RESPIRATORY_TRACT | 0 refills | Status: AC

## 2023-06-13 MED ORDER — SIMVASTATIN 20 MG PO TABS: 20.0000 mg | ORAL_TABLET | Freq: Every day | ORAL | Status: DC

## 2023-06-13 MED ORDER — PANTOPRAZOLE SODIUM 40 MG IV SOLR
40.0000 mg | Freq: Two times a day (BID) | INTRAVENOUS | Status: DC
  Administered 2023-06-13: 40 mg via INTRAVENOUS
  Filled 2023-06-13: qty 10

## 2023-06-13 MED ORDER — LEVOTHYROXINE SODIUM 50 MCG PO TABS: 75.0000 ug | ORAL_TABLET | Freq: Every day | ORAL | Status: DC

## 2023-06-13 MED ORDER — LEVOTHYROXINE SODIUM 50 MCG PO TABS
100.0000 ug | ORAL_TABLET | Freq: Every day | ORAL | Status: DC
  Administered 2023-06-13: 100 ug via ORAL
  Filled 2023-06-13: qty 2

## 2023-06-13 MED ORDER — THIAMINE HCL 100 MG/ML IJ SOLN: 100.0000 mg | Freq: Every day | INTRAMUSCULAR | Status: DC

## 2023-06-13 MED ORDER — IOHEXOL 350 MG/ML SOLN
100.0000 mL | Freq: Once | INTRAVENOUS | Status: AC | PRN
  Administered 2023-06-13: 100 mL via INTRAVENOUS

## 2023-06-13 MED ORDER — PANTOPRAZOLE SODIUM 40 MG PO TBEC
40.0000 mg | DELAYED_RELEASE_TABLET | Freq: Two times a day (BID) | ORAL | Status: DC
  Administered 2023-06-13: 40 mg via ORAL
  Filled 2023-06-13: qty 1

## 2023-06-13 MED ORDER — IPRATROPIUM-ALBUTEROL 0.5-2.5 (3) MG/3ML IN SOLN
3.0000 mL | Freq: Four times a day (QID) | RESPIRATORY_TRACT | Status: DC
  Administered 2023-06-13 (×2): 3 mL via RESPIRATORY_TRACT
  Filled 2023-06-13 (×2): qty 3

## 2023-06-13 NOTE — Assessment & Plan Note (Addendum)
Pulse ox in ER 88% with ambulation.  Patient given nebulizer treatments with improvement of saturations.  Patient holding his saturations with ambulation.  Will prescribe albuterol inhaler and Symbicort upon going home and refer to pulmonary as outpatient.

## 2023-06-13 NOTE — ED Provider Notes (Signed)
[facility] Provider Note    Event Date/Time   First MD Initiated Contact with Patient 06/12/23 2000     (approximate)   History   Chest Pain   HPI  John Pineda. is a 62 y.o. male  here with chest pain and SOB. Pt reports that over the past month or so, he has had progressively worsening DOE, fatigue, and decreased exercise tolerance. He has gone from being able to bike several miles per day to being winded with even light movement. This seemed to get worse after an ablation for AFib though otherwise he has been doing well. No known heart failure. No med changes. He did have to stop metoprolol because of low HR/blood pressure so he is not on any nodal blocking agents now.       Physical Exam   Triage Vital Signs: ED Triage Vitals  Enc Vitals Group     BP 06/12/23 1939 126/87     Pulse Rate 06/12/23 1939 (!) 55     Resp 06/12/23 1933 19     Temp 06/12/23 1939 98.3 F (36.8 C)     Temp Source 06/12/23 1939 Oral     SpO2 06/12/23 1939 100 %     Weight 06/12/23 1934 226 lb 10.1 oz (102.8 kg)     Height 06/12/23 1934 6' (1.829 m)     Head Circumference --      Peak Flow --      Pain Score 06/12/23 1933 1     Pain Loc --      Pain Edu? --      Excl. in GC? --     Most recent vital signs: Vitals:   06/12/23 2330 06/13/23 0145  BP: 105/74 (!) 133/96  Pulse: (!) 58 (!) 50  Resp: (!) 25 (!) 22  Temp:    SpO2: 96% 99%     General: Awake, no distress.  CV:  Good peripheral perfusion. Bradycardic but regular, no murmurs. Resp:  Normal work of breathing. Lungs CTAB Abd:  No distention. No tenderness. Other:  No LE edema.   ED Results / Procedures / Treatments   Labs (all labs ordered are listed, but only abnormal results are displayed) Labs Reviewed  BASIC METABOLIC PANEL - Abnormal; Notable for the following components:      Result Value   Sodium 131 (*)    Calcium 8.6 (*)    All other components within normal limits  TSH -  Abnormal; Notable for the following components:   TSH 0.131 (*)    All other components within normal limits  DIGOXIN LEVEL - Abnormal; Notable for the following components:   Digoxin Level <0.2 (*)    All other components within normal limits  CBC  BRAIN NATRIURETIC PEPTIDE  MAGNESIUM  T4, FREE  ETHANOL  HIV ANTIBODY (ROUTINE TESTING W REFLEX)  VITAMIN B1  TROPONIN I (HIGH SENSITIVITY)  TROPONIN I (HIGH SENSITIVITY)     EKG Normal sinus rhythm, VR 63. PR 172, QRS 98, QTc 435. No acute ST elevations or depressions. No ischemia or infarct.   RADIOLOGY CXR: Clear   I also independently reviewed and agree with radiologist interpretations.   PROCEDURES:  Critical Care performed: No   MEDICATIONS ORDERED IN ED: Medications  sodium chloride flush (NS) 0.9 % injection 3 mL (3 mLs Intravenous Given 06/13/23 0148)  acetaminophen (TYLENOL) tablet 650 mg (has no administration in time range)    Or  acetaminophen (TYLENOL) suppository  650 mg (has no administration in time range)  hydrALAZINE (APRESOLINE) injection 5 mg (has no administration in time range)  thiamine (VITAMIN B1) injection 100 mg (has no administration in time range)  apixaban (ELIQUIS) tablet 5 mg (5 mg Oral Given 06/13/23 0148)  gabapentin (NEURONTIN) capsule 600 mg (has no administration in time range)  levothyroxine (SYNTHROID) tablet 100 mcg (has no administration in time range)  simvastatin (ZOCOR) tablet 20 mg (has no administration in time range)  pantoprazole (PROTONIX) injection 40 mg (40 mg Intravenous Given 06/13/23 0148)  iohexol (OMNIPAQUE) 350 MG/ML injection 100 mL (100 mLs Intravenous Contrast Given 06/13/23 0106)     IMPRESSION / MDM / ASSESSMENT AND PLAN / ED COURSE  I reviewed the triage vital signs and the nursing notes.                              Differential diagnosis includes, but is not limited to, CHF, symptomatic bradycardia/arrhythmia, anemia, ?myopathy or NM disease  Patient's  presentation is most consistent with acute presentation with potential threat to life or bodily function.  The patient is on the cardiac monitor to evaluate for evidence of arrhythmia and/or significant heart rate changes  Pleasant 62 yo M with h/o AFib S/p ablation here with DOE, fatigue. Pt does appear significantly winded w/ light exertion and sats were as low as 88% w./ exertion in ED. He is bradycardic here but in sinus rhythm. No hypotension noted. Sx seem to be increasingly severe. Trop neg, doubt ischemia. He does have some chest discomfort, however, so angina a consideration still.  Will admit for TTE, eval of DOE and mild hypoxia. Additional consideration would be NM disorder/myasthenia, though less likely as he has no other fatigue sx.   FINAL CLINICAL IMPRESSION(S) / ED DIAGNOSES   Final diagnoses:  DOE (dyspnea on exertion)  Other fatigue     Rx / DC Orders   ED Discharge Orders     None        Note:  This document was prepared using Dragon voice recognition software and may include unintentional dictation errors.   Shaune Pollack, MD 06/13/23 [phone]

## 2023-06-13 NOTE — Assessment & Plan Note (Addendum)
Paroxysmal in nature.  Continue Eliquis.

## 2023-06-13 NOTE — Assessment & Plan Note (Signed)
Repeat sodium normal range

## 2023-06-13 NOTE — Discharge Summary (Signed)
Physician Discharge Summary   Patient: John Pineda. MRN: [id number] DOB: 12-11-1961  Admit date:     06/12/2023  Discharge date: 06/13/23  Discharge Physician: Alford Highland   PCP: Kandyce Rud, MD   Recommendations at discharge:   Follow-up PCP 5 days Follow-up cardiology in 1 week Refer to pulmonology 2 weeks Will need a repeat CT scan of the chest in about 6 to 12 months for pulmonary nodule.  Discharge Diagnoses: Principal Problem:   Chest pain Active Problems:   Shortness of breath/respiratory failure with hypoxia   GERD (gastroesophageal reflux disease)   Atrial fibrillation (HCC)   OSA (obstructive sleep apnea)   Secondary hypothyroidism   Sinus bradycardia   Pulmonary nodule   Atelectasis   Acute respiratory failure with hypoxia (HCC)   Elevated d-dimer   Hyponatremia   Hospital Course: 62 year old man with history of sleep apnea on CPAP at night, hyperlipidemia, atrial fibrillation status post ablation on Eliquis.  Patient has been having some shortness of breath and chest pain.  With walking in the emergency room his pulse ox was down to 88% on room air.  CT scan of the chest showed no evidence of pulmonary embolism, 7 mm noncalcified left apical lung nodule, mild mediastinal and right hilar lymphadenopathy, mild to moderate bilateral atelectatic change and moderate-sized hiatal hernia.  Patient's cardiac enzymes were negative.  06/13/2023.  Patient still has some chest discomfort.  Still with some shortness of breath.  Patient's oxygenation had improved and ambulated around the nursing station holding his sats in the mid 90s.  Patient was given incentive spirometer and some nebulizer treatments.  Will prescribe albuterol inhaler and Symbicort upon going home and refer to pulmonary as outpatient.  Patient was seen by cardiology and okay with discharge home with cardiology follow-up as outpatient.  Assessment and Plan: * Chest pain Cardiac enzymes negative x  2.  Seen by cardiology and okay with discharge home.  Reviewed echocardiogram from [facility] from 1/24 which showed a normal EF.   Shortness of breath/respiratory failure with hypoxia Pulse ox in ER 88% with ambulation.  Patient given nebulizer treatments with improvement of saturations.  Patient holding his saturations with ambulation.  Will prescribe albuterol inhaler and Symbicort upon going home and refer to pulmonary as outpatient.  Hyponatremia Repeat sodium normal range  Elevated d-dimer CT scan of the chest negative for pulmonary embolism and bilateral lower extremity sonogram negative for DVT.  Atelectasis Will send home with incentive spirometer.  Will also try inhalers.  Pulmonary nodule Patient also with hilar adenopathy and mediastinal adenopathy.  Repeat CT scan in 6 to 12 months.  Sinus bradycardia Follow-up as outpatient  Secondary hypothyroidism Continue levothyroxine   OSA (obstructive sleep apnea) CPAP per home settings.  Atrial fibrillation (HCC) Paroxysmal in nature.  Continue Eliquis.          GERD (gastroesophageal reflux disease) Continue with PPI therapy.          Consultants: Cardiology Procedures performed: None Disposition: Home Diet recommendation:  Cardiac diet DISCHARGE MEDICATION: Allergies as of 06/13/2023   No Known Allergies      Medication List     STOP taking these medications    digoxin 0.125 MG tablet Commonly known as: LANOXIN   metoprolol succinate 25 MG 24 hr tablet Commonly known as: TOPROL-XL   multivitamin tablet       TAKE these medications    albuterol 108 (90 Base) MCG/ACT inhaler Commonly known as: VENTOLIN HFA Inhale 2 puffs  into the lungs every 6 (six) hours as needed for wheezing or shortness of breath.   apixaban 5 MG Tabs tablet Commonly known as: ELIQUIS Take 1 tablet (5 mg total) by mouth 2 (two) times daily.   bromocriptine 2.5 MG tablet Commonly known as: PARLODEL Take 1.25 mg by  mouth daily.   budesonide-formoterol 80-4.5 MCG/ACT inhaler Commonly known as: Symbicort Inhale 2 puffs into the lungs 2 (two) times daily.   gabapentin 300 MG capsule Commonly known as: NEURONTIN Take 600 mg by mouth daily.   levothyroxine 100 MCG tablet Commonly known as: SYNTHROID Take 100 mcg by mouth daily before breakfast.   omeprazole 20 MG capsule Commonly known as: PRILOSEC Take 20 mg by mouth daily.   simvastatin 20 MG tablet Commonly known as: ZOCOR Take 20 mg by mouth daily.   testosterone cypionate 200 MG/ML injection Commonly known as: DEPOTESTOSTERONE CYPIONATE Inject 200 mg into the muscle every 14 (fourteen) days. What changed: Another medication with the same name was removed. Continue taking this medication, and follow the directions you see here.        Follow-up Information     Kandyce Rud, MD Follow up in 5 day(s).   Specialty: Family Medicine Contact information: [address] [facility] [place] [address] [phone]         Marcina Millard, MD Follow up in 1 week(s).   Specialty: Cardiology Contact information: [address] [facility] [place] [address] [phone]         Vida Rigger, MD Follow up in 2 week(s).   Specialty: Pulmonary Disease Contact information: [address] [place] [address] [phone]                Discharge Exam: Ceasar Mons Weights   06/12/23 1934  Weight: 102.8 kg   Physical Exam HENT:     Head: Normocephalic.     Mouth/Throat:     Pharynx: No oropharyngeal exudate.  Eyes:     General: Lids are normal.     Conjunctiva/sclera: Conjunctivae normal.  Cardiovascular:     Rate and Rhythm: Normal rate and regular rhythm.     Heart sounds: Normal heart sounds, S1 normal and S2 normal.  Pulmonary:     Breath sounds: Examination of the right-lower field reveals decreased breath sounds.  Examination of the left-lower field reveals decreased breath sounds. Decreased breath sounds present. No wheezing, rhonchi or rales.  Abdominal:     Palpations: Abdomen is soft.     Tenderness: There is no abdominal tenderness.  Musculoskeletal:     Right lower leg: No swelling.     Left lower leg: No swelling.  Skin:    General: Skin is warm.     Findings: No rash.  Neurological:     Mental Status: He is alert and oriented to person, place, and time.      Condition at discharge: stable  The results of significant diagnostics from this hospitalization (including imaging, microbiology, ancillary and laboratory) are listed below for reference.   Imaging Studies: US Venous Img Lower Bilateral (DVT)  Result Date: 06/13/2023 CLINICAL DATA:  Elevated D-dimer level. EXAM: BILATERAL LOWER EXTREMITY VENOUS DOPPLER ULTRASOUND TECHNIQUE: Gray-scale sonography with graded compression, as well as color Doppler and duplex ultrasound were performed to evaluate the lower extremity deep venous systems from the level of the common femoral vein and including the common femoral, femoral, profunda femoral, popliteal and calf veins including the  posterior tibial, peroneal and gastrocnemius veins when visible. The superficial great saphenous vein was also interrogated. Spectral Doppler was utilized to evaluate flow at rest and with distal augmentation maneuvers in the common femoral, femoral and popliteal veins. COMPARISON:  None Available. FINDINGS: RIGHT LOWER EXTREMITY Common Femoral Vein: No evidence of thrombus. Normal compressibility, respiratory phasicity and response to augmentation. Saphenofemoral Junction: No evidence of thrombus. Normal compressibility and flow on color Doppler imaging. Profunda Femoral Vein: No evidence of thrombus. Normal compressibility and flow on color Doppler imaging. Femoral Vein: No evidence of thrombus. Normal compressibility, respiratory phasicity and response to augmentation.  Popliteal Vein: No evidence of thrombus. Normal compressibility, respiratory phasicity and response to augmentation. Calf Veins: No evidence of thrombus. Normal compressibility and flow on color Doppler imaging. Superficial Great Saphenous Vein: No evidence of thrombus. Normal compressibility. Venous Reflux:  None. Other Findings:  None. LEFT LOWER EXTREMITY Common Femoral Vein: No evidence of thrombus. Normal compressibility, respiratory phasicity and response to augmentation. Saphenofemoral Junction: No evidence of thrombus. Normal compressibility and flow on color Doppler imaging. Profunda Femoral Vein: No evidence of thrombus. Normal compressibility and flow on color Doppler imaging. Femoral Vein: No evidence of thrombus. Normal compressibility, respiratory phasicity and response to augmentation. Popliteal Vein: No evidence of thrombus. Normal compressibility, respiratory phasicity and response to augmentation. Calf Veins: No evidence of thrombus. Normal compressibility and flow on color Doppler imaging. Superficial Great Saphenous Vein: No evidence of thrombus. Normal compressibility. Venous Reflux:  None. Other Findings:  None. IMPRESSION: No evidence of deep venous thrombosis in either lower extremity. Electronically Signed   By: Lupita Raider M.D.   On: 06/13/2023 13:29   CT Angio Chest Pulmonary Embolism (PE) W or WO Contrast  Result Date: 06/13/2023 CLINICAL DATA:  Suspected pulmonary embolism. EXAM: CT ANGIOGRAPHY CHEST WITH CONTRAST TECHNIQUE: Multidetector CT imaging of the chest was performed using the standard protocol during bolus administration of intravenous contrast. Multiplanar CT image reconstructions and MIPs were obtained to evaluate the vascular anatomy. RADIATION DOSE REDUCTION: This exam was performed according to the departmental dose-optimization program which includes automated exposure control, adjustment of the mA and/or kV according to patient size and/or use of iterative  reconstruction technique. CONTRAST:  OMNIPAQUE IOHEXOL 350 MG/ML SOLN COMPARISON:  None Available. FINDINGS: Cardiovascular: The thoracic aorta is normal in appearance satisfactory opacification of the pulmonary arteries to the segmental level. No evidence of pulmonary embolism. Normal heart size with mild coronary artery calcification. No pericardial effusion. Mediastinum/Nodes: 10 mm and 11 mm paratracheal lymph nodes are present. A cluster of 9 mm and 10 mm right hilar lymph nodes is seen (axial CT images 54 through 60, CT series 4). A 2.4 cm subcarinal lymph node is also noted (axial CT image 60, CT series 4). Thyroid gland, trachea, and esophagus demonstrate no significant findings. Lungs/Pleura: A 7 mm noncalcified lung nodule is seen within the anterior aspect of the left apex (axial CT image 14, CT series 5). Hazy, mild-to-moderate severity atelectatic changes are seen along the posterior and lateral aspects of both lungs. No pleural effusion or pneumothorax is identified. Upper Abdomen: There is a moderate-sized hiatal hernia. Musculoskeletal: No chest wall abnormality. No acute or significant osseous findings. Review of the MIP images confirms the above findings. IMPRESSION: 1. No evidence of pulmonary embolism. 2. 7 mm noncalcified left apical lung nodule. Non-contrast chest CT at 6-12 months is recommended. If the nodule is stable at time of repeat CT, then future CT at 18-24 months (from  today's scan) is considered optional for low-risk patients, but is recommended for high-risk patients. This recommendation follows the consensus statement: Guidelines for Management of Incidental Pulmonary Nodules Detected on CT Images: From the Fleischner Society 2017; Radiology 2017; 284:228-243. 3. Mild mediastinal and right hilar lymphadenopathy. 4. Mild to moderate severity bilateral atelectatic changes. 5. Moderate sized hiatal hernia. Electronically Signed   By: Aram Candela M.D.   On: 06/13/2023 01:31    DG Chest 2 View  Result Date: 06/12/2023 CLINICAL DATA:  CP EXAM: CHEST - 2 VIEW COMPARISON:  Chest x-ray 12/02/2021 FINDINGS: The heart and mediastinal contours are within normal limits. No focal consolidation. No pulmonary edema. No pleural effusion. No pneumothorax. No acute osseous abnormality. Chronic vertebral body height loss of the midthoracic vertebral bodies with associated exaggerated kyphosis. IMPRESSION: No active cardiopulmonary disease. Electronically Signed   By: Tish Frederickson M.D.   On: 06/12/2023 20:01    Microbiology: No results found for this or any previous visit.  Labs: CBC: Recent Labs  Lab 06/12/23 1939  WBC 5.4  HGB 13.9  HCT 42.2  MCV 91.7  PLT 214   Basic Metabolic Panel: Recent Labs  Lab 06/12/23 1939 06/12/23 2112 06/13/23 0743  NA 131*  --  136  K 3.7  --  4.1  CL 101  --  105  CO2 23  --  24  GLUCOSE 98  --  87  BUN 23  --  21  CREATININE 0.96  --  0.90  CALCIUM 8.6*  --  8.9  MG  --  2.4  --      Discharge time spent: greater than 30 minutes. Case discussed with cardiology  Signed: Alford Highland, MD [facility] 06/13/2023

## 2023-06-13 NOTE — ED Notes (Signed)
Advised nurse that patient has ready bed 

## 2023-06-13 NOTE — Assessment & Plan Note (Deleted)
Unclear if this is related to patient's alcohol use.  Patient has been followed closely by cardiology. Will request cardiology consult [facility] clinic Dr. Juliann Pares message sent.

## 2023-06-13 NOTE — Evaluation (Signed)
Occupational Therapy Evaluation Patient Details Name: John Pineda. MRN: [id number] DOB: 1961-01-07 Today's Date: 06/13/2023   History of Present Illness John Pineda is a 62 y/o male with PMH including a-fib, ETOH, OSA, hypotension, GERD; admitted with chest pain and shortness of breath.   Clinical Impression   Patient received for OT evaluation. Pt is (I) with all ADLs and mobility. See below for cardiopulmonary status during session. Patient with no further need for OT in acute care; discharge OT services.        Recommendations for follow up therapy are one component of a multi-disciplinary discharge planning process, led by the attending physician.  Recommendations may be updated based on patient status, additional functional criteria and insurance authorization.   Assistance Recommended at Discharge PRN  Patient can return home with the following      Functional Status Assessment  Patient has not had a recent decline in their functional status  Equipment Recommendations  None recommended by OT    Recommendations for Other Services       Precautions / Restrictions Precautions Precautions:  (oxygen saturation) Restrictions Weight Bearing Restrictions: No      Mobility Bed Mobility Overal bed mobility: Independent                  Transfers Overall transfer level: Independent Equipment used: None                      Balance Overall balance assessment: Independent                                         ADL either performed or assessed with clinical judgement   ADL Overall ADL's : Independent;At baseline                                       General ADL Comments: Used bathroom, able to move all extremities WNL; walked 7 laps around unit without AD.     Vision Patient Visual Report: No change from baseline       Perception     Praxis      Pertinent Vitals/Pain Pain Assessment Pain  Assessment: 0-10 Pain Score:  (pt states low chest pain after activity) Pain Location: chest Pain Descriptors / Indicators: Aching Pain Intervention(s): Monitored during session, Limited activity within patient's tolerance     Hand Dominance     Extremity/Trunk Assessment Upper Extremity Assessment Upper Extremity Assessment: Overall WFL for tasks assessed   Lower Extremity Assessment Lower Extremity Assessment: Overall WFL for tasks assessed   Cervical / Trunk Assessment Cervical / Trunk Assessment: Normal   Communication Communication Communication: No difficulties   Cognition Arousal/Alertness: Awake/alert Behavior During Therapy: WFL for tasks assessed/performed Overall Cognitive Status: Within Functional Limits for tasks assessed                                 General Comments: Very motivated     General Comments  Pt walked 7 laps around the unit at moderately fast pace, no AD, no loss of balance. Denies shortness of breath throughout; intermittently making conversation with OT. At rest (on room air) pt O2 sat 97-98%, HR 55 on pulse oximeter. After 2 laps, O2 97% HR  74. After 3 laps O2 97% HR 74. Oafter 4 laps O2 99% HR 74. At end of session seated in bed, O2 95-96% HR 75; HR soon dropped back to 55-58. Pt stating he had a nebulizer tx this morning. OT providing information on cardiopulmonary rehab, as well as purchasing a pulseoximeter for home use.    Exercises     Shoulder Instructions      Home Living Family/patient expects to be discharged to:: Private residence Living Arrangements: Spouse/significant other Available Help at Discharge: Family;Available 24 hours/day Type of Home: House Home Access: Stairs to enter [employer] of Steps: flight of stairs from garage (in basement)   Home Layout: Multi-level Alternate Level Stairs-Number of Steps: flight   Bathroom Shower/Tub: Chief Strategy Officer: Standard     Home  Equipment: Cane - single point          Prior Functioning/Environment Prior Level of Function : Independent/Modified Independent             Mobility Comments: (I). No falls. No AD. Very active. Hiking, biking, gardening, splitting firewood. Enjoys traveling (has a trip to [place] planned for August). ADLs Comments: (I) ADL/IADL. Is retired.        OT Problem List:        OT Treatment/Interventions:      OT Goals(Current goals can be found in the care plan section) Acute Rehab OT Goals Patient Stated Goal: Get better OT Goal Formulation: With patient/family  OT Frequency:      Co-evaluation              AM-PAC OT "6 Clicks" Daily Activity     Outcome Measure Help from another person eating meals?: None Help from another person taking care of personal grooming?: None Help from another person toileting, which includes using toliet, bedpan, or urinal?: None Help from another person bathing (including washing, rinsing, drying)?: None Help from another person to put on and taking off regular upper body clothing?: None Help from another person to put on and taking off regular lower body clothing?: None 6 Click Score: 24   End of Session Equipment Utilized During Treatment:  (pulse oximeter) Nurse Communication: Mobility status  Activity Tolerance: Patient tolerated treatment well Patient left:  (in w/c with transport heading to xray)                   Time: 8657-8469 OT Time Calculation (min): 28 min Charges:  OT General Charges $OT Visit: 1 Visit OT Evaluation $OT Eval Moderate Complexity: 1 Mod OT Treatments $Self Care/Home Management : 8-22 mins  Linward Foster, MS, OTR/L  Alvester Morin 06/13/2023, 12:19 PM

## 2023-06-13 NOTE — Assessment & Plan Note (Signed)
Will send home with incentive spirometer.  Will also try inhalers.

## 2023-06-13 NOTE — Assessment & Plan Note (Addendum)
Continue levothyroxine 

## 2023-06-13 NOTE — Assessment & Plan Note (Addendum)
Follow up as outpatient.  

## 2023-06-13 NOTE — Hospital Course (Signed)
62 year old man with history of sleep apnea on CPAP at night, hyperlipidemia, atrial fibrillation status post ablation on Eliquis.  Patient has been having some shortness of breath and chest pain.  With walking in the emergency room his pulse ox was down to 88% on room air.  CT scan of the chest showed no evidence of pulmonary embolism, 7 mm noncalcified left apical lung nodule, mild mediastinal and right hilar lymphadenopathy, mild to moderate bilateral atelectatic change and moderate-sized hiatal hernia.  Patient's cardiac enzymes were negative.  06/13/2023.  Patient still has some chest discomfort.  Still with some shortness of breath.  Patient's oxygenation had improved and ambulated around the nursing station holding his sats in the mid 90s.  Patient was given incentive spirometer and some nebulizer treatments.  Will prescribe albuterol inhaler and Symbicort upon going home and refer to pulmonary as outpatient.  Patient was seen by cardiology and okay with discharge home with cardiology follow-up as outpatient.

## 2023-06-13 NOTE — Assessment & Plan Note (Signed)
CPAP per home settings.  

## 2023-06-13 NOTE — Assessment & Plan Note (Addendum)
Cardiac enzymes negative x 2.  Seen by cardiology and okay with discharge home.  Reviewed echocardiogram from [facility] from 1/24 which showed a normal EF.

## 2023-06-13 NOTE — ED Notes (Signed)
Pt to CT

## 2023-06-13 NOTE — Progress Notes (Signed)
PT Cancellation Note  Patient Details Name: John Pineda. MRN: [id number] DOB: November 17, 1961   Cancelled Treatment:    Reason Eval/Treat Not Completed: PT screened, no needs identified, will sign off Environmental education officer observed pt AMB multiple laps around nursing unit at community amb speed, no device needed, no imbalance, working with OT. Pt is able to manage his own mobility at present. No current indication for acute PT at this time, signing off.)  1:20 PM, 06/13/23 Rosamaria Lints, PT, DPT Physical Therapist - [facility]  [phone] (ASCOM)    Binnie Droessler C 06/13/2023, 1:19 PM

## 2023-06-13 NOTE — Progress Notes (Signed)
Brief cardiology consult note  Impression Shortness of breath Hypoxemia Atypical chest pain History of atrial fibrillation Mild obesity Obstructive sleep apnea Hyperlipidemia History of ablation for A-fib  Plan Agree admit to rule out myocardial infarction Agree to initial evaluation on telemetry Continue Eliquis for anticoagulation for history of atrial fibrillation status post ablation Recommend ischemia workup can be done as an outpatient with either cardiac CTA versus a Myoview Consider repeating echocardiogram most recent January appeared to show preserved left ventricular function Consider pulmonary evaluation for shortness of breath and atelectasis on CT Maintained simvastatin therapy for lipid management Patient appears to be safe for early discharge and follow-up as an early outpatient cardiology follow-up patient with Dr. Darrold Junker or Leanora Ivanoff

## 2023-06-13 NOTE — Assessment & Plan Note (Signed)
Patient also with hilar adenopathy and mediastinal adenopathy.  Repeat CT scan in 6 to 12 months.

## 2023-06-13 NOTE — Assessment & Plan Note (Signed)
CT scan of the chest negative for pulmonary embolism and bilateral lower extremity sonogram negative for DVT.

## 2023-06-13 NOTE — Consult Note (Signed)
CARDIOLOGY CONSULT NOTE               Patient ID: John Pineda. MRN: [id number] DOB/AGE: [birth year] 63 y.o.  Admit date: 06/12/2023 Referring Physician Dr. Irena Cords hospitalist Primary Physician Dr. Kandyce Rud primary Primary Cardiologist Paraschos/Anna Mellissa Kohut Reason for Consultation chest pain shortness of breath history of atrial fibrillation  HPI: Patient is a 62 year old male history of obstructive sleep apnea hyperlipidemia atrial fibrillation status post ablation at [facility] patient present with chest pain symptoms over the last several weeks pain with nagging low-grade without radiation patient describes dyspnea shortness of breath and vague chest pain symptoms midsternal.  Patient is being still fairly active with hiking and biking but has had worsening dyspnea shortness of breath with activity he is also gaining some weight recently does not feel up to get up and walk currently.  Denies any recent cardiac evaluation.  Most recent echocardiogram in January 2023 showed preserved left ventricular function EKG shows sinus rhythm as well.  Currently cannot have it any significant chest pain shortness of breath no block is positive syncope  Review of systems complete and found to be negative unless listed above     Past Medical History:  Diagnosis Date   Esophageal stricture    GERD (gastroesophageal reflux disease)    Gynecomastia, male    History of Epstein-Barr virus infection 2008   History of secondary hypogonadism    Hypothyroidism    Pituitary adenoma (HCC) 2011   S/P resection   Plantar fasciitis    Pre-diabetes    RLS (restless legs syndrome)    Shortness of breath/respiratory failure with hypoxia 12/04/2021   Sleep apnea    No CPAP    Past Surgical History:  Procedure Laterality Date   BREAST BIOPSY Right    BUNIONECTOMY Right 12/2008   Great toe   COLONOSCOPY WITH ESOPHAGOGASTRODUODENOSCOPY (EGD)     2008, 2018   FRACTURE SURGERY Right     Collarbone   PARS PLANA VITRECTOMY Right 02/07/2019   PITUITARY EXCISION  2011   UVULOPALATOPHARYNGOPLASTY      Medications Prior to Admission  Medication Sig Dispense Refill Last Dose   apixaban (ELIQUIS) 5 MG TABS tablet Take 1 tablet (5 mg total) by mouth 2 (two) times daily. 60 tablet 0 06/12/2023 at 0630   bromocriptine (PARLODEL) 2.5 MG tablet Take 1.25 mg by mouth daily.   06/12/2023 at 0630   gabapentin (NEURONTIN) 300 MG capsule Take 600 mg by mouth daily.   06/12/2023 at 1900   levothyroxine (SYNTHROID) 100 MCG tablet Take 100 mcg by mouth daily before breakfast.   06/12/2023 at 0630   omeprazole (PRILOSEC) 20 MG capsule Take 20 mg by mouth daily.   06/12/2023 at 0630   simvastatin (ZOCOR) 20 MG tablet Take 20 mg by mouth daily.   06/12/2023 at 1900   testosterone cypionate (DEPOTESTOSTERONE CYPIONATE) 200 MG/ML injection Inject 200 mg into the muscle every 14 (fourteen) days.   Past Month   digoxin (LANOXIN) 0.125 MG tablet Take 1 tablet (0.125 mg total) by mouth daily. 30 tablet 0    metoprolol succinate (TOPROL-XL) 25 MG 24 hr tablet Take 0.5 tablets (12.5 mg total) by mouth every evening. 15 tablet 0    Multiple Vitamin (MULTIVITAMIN) tablet Take 1 tablet by mouth daily. (Patient not taking: Reported on 06/13/2023)   Not Taking   testosterone cypionate (DEPOTESTOTERONE CYPIONATE) 100 MG/ML injection Inject 200 mg into the muscle every 14 (fourteen) days. For IM use  only      Social History   Socioeconomic History   Marital status: Married    Spouse name: Not on file   Number of children: Not on file   Years of education: Not on file   Highest education level: Not on file  Occupational History   Not on file  Tobacco Use   Smoking status: Never   Smokeless tobacco: Never  Vaping Use   Vaping Use: Never used  Substance and Sexual Activity   Alcohol use: Yes    Alcohol/week: 2.0 standard drinks of alcohol    Types: 2 Glasses of wine per week    Comment: occasional drink    Drug use: Never   Sexual activity: Not on file  Other Topics Concern   Not on file  Social History Narrative   Lives locally w/ wife.  Jimmye Norman - very active.  Rides mountain bike fairly regularly w/o limitations.   Social Determinants of Health   Financial Resource Strain: Not on file  Food Insecurity: Not on file  Transportation Needs: Not on file  Physical Activity: Not on file  Stress: Not on file  Social Connections: Not on file  Intimate Partner Violence: Not on file    History reviewed. No pertinent family history.    Review of systems complete and found to be negative unless listed above      PHYSICAL EXAM  General: Well developed, well nourished, in no acute distress HEENT:  Normocephalic and atramatic Neck:  No JVD.  Lungs: Clear bilaterally to auscultation and percussion. Heart: HRRR . Normal S1 and S2 without gallops or murmurs.  Abdomen: Bowel sounds are positive, abdomen soft and non-tender  Msk:  Back normal, normal gait. Normal strength and tone for age. Extremities: No clubbing, cyanosis or edema.   Neuro: Alert and oriented X 3. Psych:  Good affect, responds appropriately  Labs:   Lab Results  Component Value Date   WBC 5.4 06/12/2023   HGB 13.9 06/12/2023   HCT 42.2 06/12/2023   MCV 91.7 06/12/2023   PLT 214 06/12/2023    Recent Labs  Lab 06/13/23 0743  NA 136  K 4.1  CL 105  CO2 24  BUN 21  CREATININE 0.90  CALCIUM 8.9  GLUCOSE 87   No results found for: "CKTOTAL", "CKMB", "CKMBINDEX", "TROPONINI" No results found for: "CHOL" No results found for: "HDL" No results found for: "LDLCALC" No results found for: "TRIG" No results found for: "CHOLHDL" No results found for: "LDLDIRECT"    Radiology: US Venous Img Lower Bilateral (DVT)  Result Date: 06/13/2023 CLINICAL DATA:  Elevated D-dimer level. EXAM: BILATERAL LOWER EXTREMITY VENOUS DOPPLER ULTRASOUND TECHNIQUE: Gray-scale sonography with graded compression, as well as color Doppler  and duplex ultrasound were performed to evaluate the lower extremity deep venous systems from the level of the common femoral vein and including the common femoral, femoral, profunda femoral, popliteal and calf veins including the posterior tibial, peroneal and gastrocnemius veins when visible. The superficial great saphenous vein was also interrogated. Spectral Doppler was utilized to evaluate flow at rest and with distal augmentation maneuvers in the common femoral, femoral and popliteal veins. COMPARISON:  None Available. FINDINGS: RIGHT LOWER EXTREMITY Common Femoral Vein: No evidence of thrombus. Normal compressibility, respiratory phasicity and response to augmentation. Saphenofemoral Junction: No evidence of thrombus. Normal compressibility and flow on color Doppler imaging. Profunda Femoral Vein: No evidence of thrombus. Normal compressibility and flow on color Doppler imaging. Femoral Vein: No evidence of thrombus. Normal  compressibility, respiratory phasicity and response to augmentation. Popliteal Vein: No evidence of thrombus. Normal compressibility, respiratory phasicity and response to augmentation. Calf Veins: No evidence of thrombus. Normal compressibility and flow on color Doppler imaging. Superficial Great Saphenous Vein: No evidence of thrombus. Normal compressibility. Venous Reflux:  None. Other Findings:  None. LEFT LOWER EXTREMITY Common Femoral Vein: No evidence of thrombus. Normal compressibility, respiratory phasicity and response to augmentation. Saphenofemoral Junction: No evidence of thrombus. Normal compressibility and flow on color Doppler imaging. Profunda Femoral Vein: No evidence of thrombus. Normal compressibility and flow on color Doppler imaging. Femoral Vein: No evidence of thrombus. Normal compressibility, respiratory phasicity and response to augmentation. Popliteal Vein: No evidence of thrombus. Normal compressibility, respiratory phasicity and response to augmentation. Calf  Veins: No evidence of thrombus. Normal compressibility and flow on color Doppler imaging. Superficial Great Saphenous Vein: No evidence of thrombus. Normal compressibility. Venous Reflux:  None. Other Findings:  None. IMPRESSION: No evidence of deep venous thrombosis in either lower extremity. Electronically Signed   By: Lupita Raider M.D.   On: 06/13/2023 13:29   CT Angio Chest Pulmonary Embolism (PE) W or WO Contrast  Result Date: 06/13/2023 CLINICAL DATA:  Suspected pulmonary embolism. EXAM: CT ANGIOGRAPHY CHEST WITH CONTRAST TECHNIQUE: Multidetector CT imaging of the chest was performed using the standard protocol during bolus administration of intravenous contrast. Multiplanar CT image reconstructions and MIPs were obtained to evaluate the vascular anatomy. RADIATION DOSE REDUCTION: This exam was performed according to the departmental dose-optimization program which includes automated exposure control, adjustment of the mA and/or kV according to patient size and/or use of iterative reconstruction technique. CONTRAST:  OMNIPAQUE IOHEXOL 350 MG/ML SOLN COMPARISON:  None Available. FINDINGS: Cardiovascular: The thoracic aorta is normal in appearance satisfactory opacification of the pulmonary arteries to the segmental level. No evidence of pulmonary embolism. Normal heart size with mild coronary artery calcification. No pericardial effusion. Mediastinum/Nodes: 10 mm and 11 mm paratracheal lymph nodes are present. A cluster of 9 mm and 10 mm right hilar lymph nodes is seen (axial CT images 54 through 60, CT series 4). A 2.4 cm subcarinal lymph node is also noted (axial CT image 60, CT series 4). Thyroid gland, trachea, and esophagus demonstrate no significant findings. Lungs/Pleura: A 7 mm noncalcified lung nodule is seen within the anterior aspect of the left apex (axial CT image 14, CT series 5). Hazy, mild-to-moderate severity atelectatic changes are seen along the posterior and lateral aspects of  both lungs. No pleural effusion or pneumothorax is identified. Upper Abdomen: There is a moderate-sized hiatal hernia. Musculoskeletal: No chest wall abnormality. No acute or significant osseous findings. Review of the MIP images confirms the above findings. IMPRESSION: 1. No evidence of pulmonary embolism. 2. 7 mm noncalcified left apical lung nodule. Non-contrast chest CT at 6-12 months is recommended. If the nodule is stable at time of repeat CT, then future CT at 18-24 months (from today's scan) is considered optional for low-risk patients, but is recommended for high-risk patients. This recommendation follows the consensus statement: Guidelines for Management of Incidental Pulmonary Nodules Detected on CT Images: From the Fleischner Society 2017; Radiology 2017; 284:228-243. 3. Mild mediastinal and right hilar lymphadenopathy. 4. Mild to moderate severity bilateral atelectatic changes. 5. Moderate sized hiatal hernia. Electronically Signed   By: Aram Candela M.D.   On: 06/13/2023 01:31   DG Chest 2 View  Result Date: 06/12/2023 CLINICAL DATA:  CP EXAM: CHEST - 2 VIEW COMPARISON:  Chest x-ray 12/02/2021  FINDINGS: The heart and mediastinal contours are within normal limits. No focal consolidation. No pulmonary edema. No pleural effusion. No pneumothorax. No acute osseous abnormality. Chronic vertebral body height loss of the midthoracic vertebral bodies with associated exaggerated kyphosis. IMPRESSION: No active cardiopulmonary disease. Electronically Signed   By: Tish Frederickson M.D.   On: 06/12/2023 20:01    EKG: Normal sinus rhythm rate of 60 nonspecific ST changes  ASSESSMENT AND PLAN:  Atypical chest pain Shortness of breath dyspnea on exertion Transient hypoxemia Mild obesity History of atrial fibrillation status post ablation Hyperlipidemia Relative hypotension Mild bradycardia . Plan Agree admit rule out myocardial infarction follow-up EKGs and troponins Consider echocardiogram  for reassessment of left ventricular function constipation complaint shortness of breath Follow-up EKGs appear to be nonischemic nondiagnostic Consider ischemia workup either with functional study electromyography or cardiac CTA Continue simvastatin therapy for lipid management Recommend weight loss exercise portion control Continue CPAP for obstructive sleep apnea Consider referral to pulmonary for shortness of breath atelectasis on CT family history of pulmonary fibrosis Consider early discharge follow-up with cardiology as an outpatient  Signed: Alwyn Pea MD 06/13/2023, 5:08 PM

## 2023-06-13 NOTE — Assessment & Plan Note (Signed)
Continue with PPI therapy 

## 2023-06-13 NOTE — Progress Notes (Signed)
PHARMACIST - PHYSICIAN COMMUNICATION  DR:   Renae Gloss  CONCERNING: IV to Oral Route Change Policy  RECOMMENDATION: This patient is receiving thiamine by the intravenous route.  Based on criteria approved by the Pharmacy and Therapeutics Committee, the intravenous medication(s) is/are being converted to the equivalent oral dose form(s).   DESCRIPTION: These criteria include: The patient is eating (either orally or via tube) and/or has been taking other orally administered medications for a least 24 hours The patient has no evidence of active gastrointestinal bleeding or impaired GI absorption (gastrectomy, short bowel, patient on TNA or NPO).  If you have questions about this conversion, please contact the Pharmacy Department  []   [phone] )  [facility] [x]   [phone] )  [facility] []   [phone] )  [facility] []   [phone] )  [facility] []   [phone] )  [facility]   Lowella Bandy, [place] 06/13/2023 9:41 AM

## 2023-06-22 ENCOUNTER — Other Ambulatory Visit: Payer: Self-pay | Admitting: Pulmonary Disease

## 2023-06-22 DIAGNOSIS — Z1389 Encounter for screening for other disorder: Secondary | ICD-10-CM

## 2023-06-29 ENCOUNTER — Ambulatory Visit
Admission: RE | Admit: 2023-06-29 | Discharge: 2023-06-29 | Disposition: A | Discharge status: Home / Self Care | Payer: BC Managed Care – PPO | Source: Ambulatory Visit | Attending: Pulmonary Disease | Admitting: Pulmonary Disease

## 2023-06-29 DIAGNOSIS — Z1389 Encounter for screening for other disorder: Secondary | ICD-10-CM | POA: Insufficient documentation

## 2023-07-15 DIAGNOSIS — I7121 Aneurysm of the ascending aorta, without rupture: Secondary | ICD-10-CM | POA: Insufficient documentation

## 2023-07-15 DIAGNOSIS — J849 Interstitial pulmonary disease, unspecified: Secondary | ICD-10-CM | POA: Insufficient documentation

## 2023-10-16 ENCOUNTER — Emergency Department: Payer: BC Managed Care – PPO

## 2023-10-16 ENCOUNTER — Other Ambulatory Visit: Payer: Self-pay

## 2023-10-16 ENCOUNTER — Emergency Department
Admission: EM | Admit: 2023-10-16 | Discharge: 2023-10-16 | Disposition: A | Discharge status: Home / Self Care | Payer: BC Managed Care – PPO | Attending: Emergency Medicine | Admitting: Emergency Medicine

## 2023-10-16 ENCOUNTER — Encounter: Payer: Self-pay | Admitting: Emergency Medicine

## 2023-10-16 DIAGNOSIS — Z7901 Long term (current) use of anticoagulants: Secondary | ICD-10-CM | POA: Diagnosis not present

## 2023-10-16 DIAGNOSIS — R0789 Other chest pain: Secondary | ICD-10-CM | POA: Insufficient documentation

## 2023-10-16 DIAGNOSIS — R079 Chest pain, unspecified: Secondary | ICD-10-CM | POA: Diagnosis present

## 2023-10-16 LAB — BASIC METABOLIC PANEL
Anion gap: 9 (ref 5–15)
BUN: 23 mg/dL (ref 8–23)
CO2: 26 mmol/L (ref 22–32)
Calcium: 9.2 mg/dL (ref 8.9–10.3)
Chloride: 101 mmol/L (ref 98–111)
Creatinine, Ser: 1.01 mg/dL (ref 0.61–1.24)
GFR, Estimated: >60 mL/min (ref >60)
Glucose, Bld: 103 mg/dL — ABNORMAL HIGH (ref 70–99)
Potassium: 4.2 mmol/L (ref 3.5–5.1)
Sodium: 136 mmol/L (ref 135–145)

## 2023-10-16 LAB — CBC
HCT: 41.7 % (ref 39.0–52.0)
Hemoglobin: 14.1 g/dL (ref 13.0–17.0)
MCH: 31.4 pg (ref 26.0–34.0)
MCHC: 33.8 g/dL (ref 30.0–36.0)
MCV: 92.9 fL (ref 80.0–100.0)
Platelets: 211 10*3/uL (ref 150–400)
RBC: 4.49 MIL/uL (ref 4.22–5.81)
RDW: 13.2 % (ref 11.5–15.5)
WBC: 5.1 10*3/uL (ref 4.0–10.5)
nRBC: 0 % (ref 0.0–0.2)

## 2023-10-16 LAB — TROPONIN I (HIGH SENSITIVITY)
Troponin I (High Sensitivity): 6 ng/L (ref <18)
Troponin I (High Sensitivity): 6 ng/L (ref <18)

## 2023-10-16 LAB — BRAIN NATRIURETIC PEPTIDE: B Natriuretic Peptide: 30 pg/mL (ref 0.0–100.0)

## 2023-10-16 MED ORDER — MORPHINE SULFATE (PF) 4 MG/ML IV SOLN
4.0000 mg | Freq: Once | INTRAVENOUS | Status: DC
  Filled 2023-10-16: qty 1

## 2023-10-16 MED ORDER — IOHEXOL 350 MG/ML SOLN
100.0000 mL | Freq: Once | INTRAVENOUS | Status: AC | PRN
  Administered 2023-10-16: 100 mL via INTRAVENOUS

## 2023-10-16 MED ORDER — ONDANSETRON HCL 4 MG/2ML IJ SOLN
4.0000 mg | Freq: Once | INTRAMUSCULAR | Status: AC
Start: 2023-10-16 — End: 2023-10-16
  Administered 2023-10-16: 4 mg via INTRAVENOUS
  Filled 2023-10-16: qty 2

## 2023-10-16 NOTE — ED Triage Notes (Signed)
Pt to ED via POV for chest pain that started yesterday. Pt states that the pain comes and goes but has continued throughout the night. Pt states that he has had some nausea. Pt denies shortness of breath. Pt states that he has a hx/o well controlled A. Fib but denies hx/o MI. Pt states that he is having decreased sensation in his left arm. Pt reports that the last time the sensation in his arm was completely normal was mid-day yesterday. Pt denies changes in speech or vision. Pt denies weakness in the left leg or headache. Pt has normal speech in triage. Pt has equal grip bilaterally with no arm drift noted.

## 2023-10-16 NOTE — ED Provider Notes (Signed)
[facility] Provider Note    Event Date/Time   First MD Initiated Contact with Patient 10/16/23 [phone]     (approximate)   History   Chest Pain   HPI  John Pineda. is a 62 y.o. male with atrial fibrillation status post ablation currently on Eliquis he comes in with chest pain.  Patient reports that he has a known ascending thoracic aorta of 4.5 cm.  He reports having some chest discomfort that started yesterday with some changes in his left arm.  He states he has a hard time describing what these changes are.  He initially thought it was more like a tingling and like a pain.  He denies any other symptoms associated with that and the symptoms have been ongoing since yesterday.  He denies any falls or hitting his head, abdominal pain or any other concerns.  No prior history of stent placement.   Physical Exam   Triage Vital Signs: ED Triage Vitals  Encounter Vitals Group     BP 10/16/23 0754 123/88     Systolic BP Percentile --      Diastolic BP Percentile --      Pulse Rate 10/16/23 0754 (!) 56     Resp 10/16/23 0754 16     Temp 10/16/23 0754 98 F (36.7 C)     Temp Source 10/16/23 0754 Oral     SpO2 10/16/23 0754 97 %     Weight 10/16/23 0755 228 lb (103.4 kg)     Height 10/16/23 0755 6' (1.829 m)     Head Circumference --      Peak Flow --      Pain Score 10/16/23 0754 1     Pain Loc --      Pain Education --      Exclude from Growth Chart --     Most recent vital signs: Vitals:   10/16/23 0754  BP: 123/88  Pulse: (!) 56  Resp: 16  Temp: 98 F (36.7 C)  SpO2: 97%     General: Awake, no distress.  CV:  Good peripheral perfusion.  Resp:  Normal effort.  Abd:  No distention.  Other:  No swelling in legs.  No calf tenderness.  Good distal pulses radially.  No obvious swelling to the arms or legs.  He has a equal strength in bilateral arms report sensation is similar throughout his body.  Good grip strength.  NIH stroke scale is  0. No pain of the neck. No rash noted  ED Results / Procedures / Treatments   Labs (all labs ordered are listed, but only abnormal results are displayed) Labs Reviewed  BASIC METABOLIC PANEL  CBC  TROPONIN I (HIGH SENSITIVITY)     EKG  My interpretation of EKG:  Sinus bradycardia rate of 53 without any ST elevation but does have T wave version lead III, aVF and V3, normal intervals  Reviewed prior EKG and he had similar EKG changes in lead III and aVF previously  RADIOLOGY I have reviewed the xray personally and interpreted and no pneumonia   PROCEDURES:  Critical Care performed: No  .1-3 Lead EKG Interpretation  Performed by: Concha Se, MD Authorized by: Concha Se, MD     Interpretation: abnormal     ECG rate:  50   ECG rate assessment: normal     Rhythm: sinus bradycardia     Ectopy: none     Conduction: normal  MEDICATIONS ORDERED IN ED: Medications  morphine (PF) 4 MG/ML injection 4 mg (0 mg Intravenous Hold 10/16/23 0908)  ondansetron (ZOFRAN) injection 4 mg (4 mg Intravenous Given 10/16/23 0907)  iohexol (OMNIPAQUE) 350 MG/ML injection 100 mL (100 mLs Intravenous Contrast Given 10/16/23 0912)     IMPRESSION / MDM / ASSESSMENT AND PLAN / ED COURSE  I reviewed the triage vital signs and the nursing notes.   Patient's presentation is most consistent with acute presentation with potential threat to life or bodily function.   Patient comes in with some chest pain and some left arm sensation changes.  His NIH stroke scale is 0 at this time.  He has a hard time qualifying exactly what he is feeling in the left arm but when asked if his sensation felt the same he stated yes that he is got equal grip strength and no pronator drift.  He is out of the window for stroke code regardless given this has been ongoing since yesterday has no evidence of LVO therefore stroke code was not called.  Will get CT head to evaluate for any type of intracranial  hemorrhage, CT dissection given history of aneurysm as well as cardiac markers to evaluate for ACS and treat patient's pain with some morphine, Zofran and reevaluate.  Troponin was negative BMP reassuring CBC reassuring  CT head negative  IMPRESSION: CHEST:   1. No aortic dissection or aneurysm. 2. Patchy ground-glass densities in the dependent lower lobes and lingula suggests atelectasis or mild edema.   PELVIS:   1. No aortic dissection or aneurysm. 2. No acute findings in the abdomen pelvis. 3. Moderate size hiatal hernia.  Discussed with patient the CT results.  We discussed the possibility of this being from edema versus atelectasis versus he is being worked up for interstitial lung disease.  This time he is got no symptoms to suggest infectious cause.  He also has no symptoms of it being edema no shortness of breath not hypoxic no leg swelling.  He is got borderline blood pressures and I would be hesitant to start any Lasix today given he does report having lower blood pressures with standing.  He has a BNP that is in process but I did review a echocardiogram that the family had on their phone that was back in July that did show normal EF.  I discussed with him that if this BNP came back elevated they can discuss further with her cardiologist but at this time agree to hold off on any Lasix given asymptomatic.  We also discussed the moderate size annual hernia and they are aware of this.  He is already on PPI.  At this time he reports no symptoms.  Repeat stroke scale is 0.  He denies any weakness or tingling and sensations intact.  We discussed utility of MRI but at this time it seems very unlikely stroke or mass given his normal neurological exam however we did discuss returning if symptoms are worsening or change and he expressed understanding but he also felt this was not neurological in nature.  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  We did also discuss that I cannot predict future heart attack so again if he develops change in symptoms or worsening symptoms he needs to return to the ER immediately and he expressed understanding.  His wife is at bedside  who witnessed this conversation and they both feel comfortable with plan for discharge and follow-up with cardiology outpatient.  BNP is still pending at time of discharge  The patient is on the cardiac monitor to evaluate for evidence of arrhythmia and/or significant heart rate changes.      FINAL CLINICAL IMPRESSION(S) / ED DIAGNOSES   Final diagnoses:  Chest pain, unspecified type     Rx / DC Orders   ED Discharge Orders     None        Note:  This document was prepared using Dragon voice recognition software and may include unintentional dictation errors.   Concha Se, MD 10/16/23 [phone]

## 2023-10-16 NOTE — Discharge Instructions (Addendum)
Your workup was reassuring but you can follow-up with your cardiologist to discuss them to decide if you need further echocardiogram or further workup of this.  Return to the ER if you develop worsening symptoms changes symptoms or any other concerns  CHEST:   1. No aortic dissection or aneurysm. 2. Patchy ground-glass densities in the dependent lower lobes and lingula suggests atelectasis or mild edema.   PELVIS:   1. No aortic dissection or aneurysm. 2. No acute findings in the abdomen pelvis. 3. Moderate size hiatal hernia.

## 2024-01-05 ENCOUNTER — Other Ambulatory Visit: Payer: Self-pay | Admitting: Medical Genetics

## 2024-06-15 ENCOUNTER — Other Ambulatory Visit: Payer: Self-pay | Admitting: Emergency Medicine

## 2024-06-15 DIAGNOSIS — J849 Interstitial pulmonary disease, unspecified: Secondary | ICD-10-CM

## 2024-06-16 ENCOUNTER — Other Ambulatory Visit: Payer: Self-pay | Admitting: Emergency Medicine

## 2024-06-16 DIAGNOSIS — J849 Interstitial pulmonary disease, unspecified: Secondary | ICD-10-CM

## 2024-06-21 ENCOUNTER — Ambulatory Visit
Admission: RE | Admit: 2024-06-21 | Discharge: 2024-06-21 | Disposition: A | Discharge status: Home / Self Care | Source: Ambulatory Visit | Attending: Emergency Medicine | Admitting: Emergency Medicine

## 2024-06-21 DIAGNOSIS — J849 Interstitial pulmonary disease, unspecified: Secondary | ICD-10-CM

## 2024-08-18 ENCOUNTER — Other Ambulatory Visit: Payer: Self-pay | Admitting: Medical Genetics

## 2024-08-18 DIAGNOSIS — Z006 Encounter for examination for normal comparison and control in clinical research program: Secondary | ICD-10-CM

## 2024-09-08 LAB — GENECONNECT MOLECULAR SCREEN: Genetic Analysis Overall Interpretation: NEGATIVE

## 2024-09-25 NOTE — Progress Notes (Signed)
 Established Patient Visit   Chief Complaint: Chief Complaint  Patient presents with  . Atrial Fibrillation    4 mo   Date of Service: 09/25/2024 Date of Birth: 05-22-1961 PCP: Diedra Jerona Ruts, MD  History of Present Illness: Mr. John Pineda is a 63 y.o.male patient returns for   1.  Paroxysmal atrial fibrillation  2.  Catheter ablation 11/18/2022 at [facility]  3.  Hyperlipidemia  4.  Interstitial lung disease  5.  Obstructive sleep apnea   Patient initially seen after reporting 36-month history of intermittent palpitations, typically noted at night.  The patient experienced a prolonged episode on Thanksgiving day and purchased a Fitbit.  On 12/01/2021, the patient experienced a prolonged heart racing episode and the Fitbit revealed possible atrial fibrillation at a rate in the 130s.  He presented to [facility] ER the next day where ECG revealed atrial fibrillation with RVR.  The patient was treated with diltiazem  drip and converted to sinus rhythm overnight.  2D echocardiogram on 12/02/2021 while the patient was in atrial fibrillation revealed LVEF 35 to 40%.  He was discharged on Eliquis  and metoprolol  succinate, as well as digoxin  for rate and rhythm control.  Repeat 2D echocardiogram on 01/14/2022 revealed normal left ventricular function with LVEF greater than 55% with mild pulmonic regurgitation with mildly dilated ascending aorta measuring 3.5 cm.  The patient wore a 7-day Holter monitor from 1/3-1/09/2022, which revealed predominant sinus bradycardia with a mean heart rate of 57 bpm with a minimum heart rate of 39 bpm.  There were frequent premature atrial contractions and rare premature ventricular contractions present.  There were infrequent atrial runs, the longest lasting 30 beats.  The patient underwent a sleep study on 12/30/2021, which revealed an AHI of 10.8 and 12.6. CPAP was not received, but he reports >10 pound weight loss since then.  The patient is status post catheter ablation for  atrial fibrillation on 11/18/2022 at [facility]. He has been compliant with his CPAP since the ablation.  On 06/12/2023, the patient noted mild left-sided chest discomfort that seemed to be present most of the day. He presented to [facility] ER and was noted to be hypoxic at 88% on room air.  Chest CTA was negative for PE, but did show a 7 mm noncalcified left apical lung nodule, mild mediastinal and right hilar lymphadenopathy, mild to moderate bilateral atelectasis, and moderate hiatal hernia.  High-sensitivity troponins were negative.  He was given nebulizer treatments, incentive spirometer, albuterol , and Symbicort , and referred to pulmonary as outpatient.  With these treatments, shortness of breath improved.    Patient returns for follow-up, reports doing well.  He denies exertional chest pain or shortness of breath.  While the patient was checking in, he developed atrial fibrillation.  Since his last office visit 05/29/2024 he has had 9 episodes of atrial fibrillation typically lasting greater than 2 hours..  Overall, he reports much less episodes of atrial fibrillation with shorter duration following AF catheter ablation.  He denies peripheral edema.  The patient is active but does not exercise regularly.  ECG reveals atrial fibrillation at a rate of 141 bpm.  Echocardiogram on 01/11/2023 revealed normal left ventricular function with LVEF 50-55% with moderate bi-atrial enlargement, mild mitral and tricuspid regurgitation.   Chest CT 06/29/2023 revealed aortic atherosclerosis with 2 vessel coronary artery disease, and an aneurysmal dilatation of the ascending thoracic aorta (4.5 cm in diameter) with a recommendation for semi-annual imaging follow-up by CTA or MRA, and referral to cardiothoracic surgery.   Repeat  chest CTA 10/16/2023 showed no evidence of aortic aneurysm.    Past Medical and Surgical History  Past Medical History Past Medical History:  Diagnosis Date  . Atrial fibrillation (CMS/HHS-HCC)   .  Chickenpox   . Food intolerance 2015   Mild intolerance to lowfat milk.  Other dairy products are not an issue.  SABRA GERD (gastroesophageal reflux disease) 09/23/2011  . Gynecomastia    On right, documented by biopsy.  SABRA History of chest pain 10/22/2003   With normal stress echo 11/04.  SABRA History of esophageal stricture    Noted on EGD 01/12/2006.  SABRA Hyperlipidemia 09/23/2011  . Pituitary adenoma (CMS/HHS-HCC) 08/21/2010   Status post resection at [facility] 9/11.  . Plantar fasciitis   . PONV (postoperative nausea and vomiting)   . Prediabetes 2024  . Restless leg syndrome 09/23/2011  . 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 Sleep apnea     Past Surgical History He has a past surgical history that includes Transphenoidal / transnasal hypophysectomy / resection pituitary tumor; Right breast biopsy; Hallux rigidis repair right great toe (12/2008); Fracture surgery; egd (2007); Colonoscopy (10/2007); Pituitary surgery (08/22/2010); Colonoscopy (11/01/2017); egd (11/01/2017);  RIGHT EYE PARS PLANA VITRECTOMY, FORCEPS MEMBRANE PEEL OF THE INTERNAL LIMITING MEMBRANE/EPIRETINAL MEMBRANE, ENDO CYANINE GREEN DYE STAIN, AIR FLUID EXCHANGE, ENDO LASER (Right, 02/07/2019); and cardiac focal ablation utilizing radiation therapy .   Medications and Allergies  Current Medications  Current Outpatient Medications  Medication Sig Dispense Refill  . atorvastatin (LIPITOR) 20 MG tablet Take 1 tablet (20 mg total) by mouth once daily 90 tablet 3  . bromocriptine  (PARLODEL ) 2.5 mg tablet TAKE ONE-HALF TABLET 5 DAYS PER WEEK ONLY. 32 tablet 1  . budesonide -formoteroL  (SYMBICORT ) 160-4.5 mcg/actuation inhaler Inhale 2 inhalations into the lungs 2 (two) times daily 10.2 g 1  . ELIQUIS  5 mg tablet TAKE 1 TABLET BY MOUTH TWICE A DAY 180 tablet 2  . fluticasone propionate (FLONASE) 50 mcg/actuation nasal spray Place 1 spray into both nostrils 2 (two) times daily as needed for  Rhinitis 16 g 11  . gabapentin  (NEURONTIN ) 300 MG capsule Take 1 capsule (300 mg total) by mouth at bedtime Takes 1 Daily 90 capsule 3  . ipratropium (ATROVENT) 21 mcg (0.03 %) nasal spray Place 1 spray into both nostrils 2 (two) times daily as needed for Rhinitis 30 mL 11  . levalbuterol (XOPENEX HFA) inhaler Inhale 2 inhalations into the lungs every 4 (four) hours as needed for Wheezing 15 g 1  . levothyroxine  (SYNTHROID ) 100 MCG tablet Take 1 tablet (100 mcg total) by mouth once daily Take on an empty stomach with a glass of water at least 30-60 minutes before breakfast. 90 tablet 3  . omeprazole (PRILOSEC) 20 MG DR capsule Take 1 capsule (20 mg total) by mouth once daily After 30 days, can resume previous dose 90 capsule 1  . syringe with needle, disp, (BD LUER-LOK SYRINGE) 1 mL 20 x 1 Syrg   1  . testosterone cypionate (DEPO-TESTOSTERONE) 200 mg/mL injection INJECT 1 ML (200 MG TOTAL) INTO THE MUSCLE EVERY 14 DAYS 6 mL 1  . albuterol  MDI, PROVENTIL , VENTOLIN , PROAIR , HFA 90 mcg/actuation inhaler Inhale 2 Inhalations into the lungs every 6 (six) hours as needed (Patient not taking: Reported on 09/25/2024)    . dilTIAZem  (CARDIZEM ) 60 MG immediate release tablet Take 1 tablet (60 mg total) by mouth 3 (three) times daily as needed 90 tablet 11   No current facility-administered medications  for this visit.    Allergies: Patient has no known allergies.  Social and Family History  Social History  reports that he has never smoked. He has never used smokeless tobacco. He reports current alcohol use of about 4.0 standard drinks of alcohol per week. He reports that he does not currently use drugs after having used the following drugs: Other-see comments.  Family History Family History  Problem Relation Name Age of Onset  . Prostate cancer Maternal Grandfather    . Prostate cancer Paternal Grandfather    . Pulmonary fibrosis Father Atari Novick        Died 11/12/21 of Pulmonary Fibrosis.  Textile and Consulting civil engineer, Engineering geologist, 40 year smoker  . Pulmonary fibrosis Maternal Grandmother Dreshawn Hendershott        Died 01/03/98 of Pulmonary Fibrosis.  Textile Education officer, environmental.  . Anesthesia problems Neg Hx      Review of Systems   Review of Systems: The patient denies chest pain, reports chronic exertional shortness of breath, without orthopnea, paroxysmal nocturnal dyspnea, pedal edema, with palpitations, with heart racing, without presyncope, syncope. Review of 8 Systems is negative except as described above.  Physical Examination   Vitals:BP 110/70   Pulse (!) 141   Ht 182.9 cm (6')   Wt (!) 105.2 kg (232 lb)   SpO2 98%   BMI 31.46 kg/m  Ht:182.9 cm (6') Wt:(!) 105.2 kg (232 lb) ADJ:Anib surface area is 2.31 meters squared. Body mass index is 31.46 kg/m.  General: Alert and oriented. Well-appearing. No acute distress. HEENT: Pupils equally reactive to light and accomodation    Neck: no JVD Lungs: Normal effort of breathing; clear to auscultation bilaterally; no wheezes, rales, rhonchi Heart: Regular rate and rhythm. No murmur, rub, or gallop Abdomen: nondistended Extremities: no cyanosis, clubbing, or edema Peripheral Pulses: 2+ radial  Skin: Warm, dry, no diaphoresis  Assessment   63 y.o. male with  1. Paroxysmal atrial fibrillation (CMS/HHS-HCC)   2. Status post catheter ablation of atrial fibrillation   3. Dilated cardiomyopathy (CMS/HHS-HCC)   4. Interstitial lung disease (CMS/HHS-HCC)   5. Pure hypercholesterolemia     63 year old gentleman with paroxysmal atrial fibrillation, with an episode of RVR, converted to sinus rhythm on diltiazem  drip, currently on Eliquis  for stroke prevention with CHA2DS2-VASc score of 0, and metoprolol  succinate and digoxin  for rate and rhythm control.  2D echocardiogram revealed mild to moderate the reduced left ventricular function while the patient was in atrial fibrillation.  Repeat 2D echocardiogram  reveals normal left ventricular function.  The patient has a history of obstructive sleep apnea, diagnosed in 2008, with a recent positive sleep study. The patient recently underwent catheter ablation for atrial fibrillation 11/18/2022. He has been using his CPAP consistently since ablation.  The patient was recently admitted to [facility] 06/12/2023 for left-sided chest discomfort and 2 to 58-month history of worsening exertional dyspnea.  He was noted to be hypoxic at 88% upon walking into the ER.  Chest CTA negative for PE, but did show 7 mm noncalcified left apical lung nodule, lymphadenopathy, atelectasis, and hiatal hernia.  He ruled out for ACS.  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 He received a course of antibiotics and inhalers and reports significant improvement of his breathing and energy level. Palpitations have also been less frequent. He was noted to have an ascending thoracic aortic aneurysm on chest  CT in 06/2023, measuring 4.5 cm. Repeat chest CTA 09/2023 did not show evidence of aortic aneurysm.  Patient develops atrial fibrillation with rapid ventricular rate while checking in for his office visit today.  Patient minimally symptomatic.   Plan   1.  Continue current medications 2.  Counseled patient about low-cholesterol diet 3.  Continue simvastatin  for hyperlipidemia management 4.  Low-fat and cholesterol diet printed instructions given to the patient 5.  Continue Eliquis  for stroke prevention 6.  Diltiazem  60 mg 7.  Prescription filled diltiazem  60 mg 3 times daily as needed 8.  14-day Holter monitor 9.  Follow-up with [facility] 10.  Return to clinic after 14-day Holter monitor  Orders Placed This Encounter  Procedures  . CARD holter monitoring from 3 to 7 days - hook up  . ECG 12-lead    Return in about 1 week (around 10/02/2024), or after 14 d  holter.  MARSA DOOMS, MD PhD [facility]

## 2024-09-27 ENCOUNTER — Other Ambulatory Visit: Payer: Self-pay | Admitting: Physician Assistant

## 2024-09-27 DIAGNOSIS — I480 Paroxysmal atrial fibrillation: Secondary | ICD-10-CM

## 2024-09-27 DIAGNOSIS — I7121 Aneurysm of the ascending aorta, without rupture: Secondary | ICD-10-CM

## 2024-09-28 ENCOUNTER — Emergency Department

## 2024-09-28 ENCOUNTER — Encounter: Payer: Self-pay | Admitting: Emergency Medicine

## 2024-09-28 ENCOUNTER — Other Ambulatory Visit: Payer: Self-pay

## 2024-09-28 ENCOUNTER — Emergency Department
Admission: EM | Admit: 2024-09-28 | Discharge: 2024-09-28 | Disposition: A | Discharge status: Home / Self Care | Attending: Emergency Medicine | Admitting: Emergency Medicine

## 2024-09-28 DIAGNOSIS — Z7901 Long term (current) use of anticoagulants: Secondary | ICD-10-CM | POA: Diagnosis not present

## 2024-09-28 DIAGNOSIS — K449 Diaphragmatic hernia without obstruction or gangrene: Secondary | ICD-10-CM | POA: Insufficient documentation

## 2024-09-28 DIAGNOSIS — I4891 Unspecified atrial fibrillation: Secondary | ICD-10-CM | POA: Insufficient documentation

## 2024-09-28 DIAGNOSIS — R911 Solitary pulmonary nodule: Secondary | ICD-10-CM | POA: Diagnosis not present

## 2024-09-28 DIAGNOSIS — R079 Chest pain, unspecified: Secondary | ICD-10-CM

## 2024-09-28 DIAGNOSIS — E039 Hypothyroidism, unspecified: Secondary | ICD-10-CM | POA: Diagnosis not present

## 2024-09-28 DIAGNOSIS — M79609 Pain in unspecified limb: Secondary | ICD-10-CM

## 2024-09-28 DIAGNOSIS — R202 Paresthesia of skin: Secondary | ICD-10-CM | POA: Diagnosis not present

## 2024-09-28 DIAGNOSIS — R0789 Other chest pain: Secondary | ICD-10-CM | POA: Diagnosis present

## 2024-09-28 LAB — HEPATIC FUNCTION PANEL
ALT: 20 U/L (ref 0–44)
AST: 22 U/L (ref 15–41)
Albumin: 3.9 g/dL (ref 3.5–5.0)
Alkaline Phosphatase: 89 U/L (ref 38–126)
Bilirubin, Direct: <0.1 mg/dL (ref 0.0–0.2)
Total Bilirubin: 0.4 mg/dL (ref 0.0–1.2)
Total Protein: 7.2 g/dL (ref 6.5–8.1)

## 2024-09-28 LAB — BASIC METABOLIC PANEL WITH GFR
Anion gap: 6 (ref 5–15)
BUN: 19 mg/dL (ref 8–23)
CO2: 28 mmol/L (ref 22–32)
Calcium: 9 mg/dL (ref 8.9–10.3)
Chloride: 105 mmol/L (ref 98–111)
Creatinine, Ser: 0.95 mg/dL (ref 0.61–1.24)
GFR, Estimated: >60 mL/min (ref >60)
Glucose, Bld: 107 mg/dL — ABNORMAL HIGH (ref 70–99)
Potassium: 3.9 mmol/L (ref 3.5–5.1)
Sodium: 139 mmol/L (ref 135–145)

## 2024-09-28 LAB — CBC
HCT: 41 % (ref 39.0–52.0)
Hemoglobin: 13.7 g/dL (ref 13.0–17.0)
MCH: 31.6 pg (ref 26.0–34.0)
MCHC: 33.4 g/dL (ref 30.0–36.0)
MCV: 94.7 fL (ref 80.0–100.0)
Platelets: 211 K/uL (ref 150–400)
RBC: 4.33 MIL/uL (ref 4.22–5.81)
RDW: 13.5 % (ref 11.5–15.5)
WBC: 6.5 K/uL (ref 4.0–10.5)
nRBC: 0 % (ref 0.0–0.2)

## 2024-09-28 LAB — T4, FREE: Free T4: 0.79 ng/dL (ref 0.61–1.12)

## 2024-09-28 LAB — TROPONIN I (HIGH SENSITIVITY)
Troponin I (High Sensitivity): 6 ng/L (ref <18)
Troponin I (High Sensitivity): 5 ng/L (ref <18)

## 2024-09-28 LAB — TSH: TSH: 0.435 u[IU]/mL (ref 0.350–4.500)

## 2024-09-28 MED ORDER — ASPIRIN 81 MG PO CHEW
324.0000 mg | CHEWABLE_TABLET | Freq: Once | ORAL | Status: AC
  Administered 2024-09-28: 324 mg via ORAL
  Filled 2024-09-28: qty 4

## 2024-09-28 MED ORDER — IOHEXOL 350 MG/ML SOLN
100.0000 mL | Freq: Once | INTRAVENOUS | Status: AC | PRN
Start: 2024-09-28 — End: 2024-09-28
  Administered 2024-09-28: 100 mL via INTRAVENOUS

## 2024-09-28 NOTE — ED Notes (Signed)
 CCMD was called at this time.

## 2024-09-28 NOTE — ED Provider Notes (Signed)
[facility] Provider Note    Event Date/Time   First MD Initiated Contact with Patient 09/28/24 [phone]     (approximate)   History   Arm Pain   HPI  John Fuqua. is a 64 y.o. male   Past medical history of atrial fibrillation on Eliquis , hypothyroid, here with chest pressure and radiation to left arm when he awoke this morning.  Felt the vague chest pressure in his substernal chest as well as a pain in his left elbow and a tingling sensation distally from elbow down to fingertips.  He went to bed feeling fine last night.  He has had no recent illnesses or trauma.  He did not take any medications prior to coming to the hospital.  Currently he has very minimal chest discomfort and the tingling sensation is all but gone away.  He has no shortness of breath.  Independent Historian contributed to assessment above: His wife at bedside gives collateral information  External Medical Documents Reviewed: Dr. Ammon recent cardiology note -at that time he notes that he had a history of a ascending thoracic aneurysm, to 4.5 cm but on a recheck imaging it was no longer apparent.  Most recent chest imaging in July 2025 shows ascending thoracic aorta measuring approximately 4.1 cm      Physical Exam   Triage Vital Signs: ED Triage Vitals  Encounter Vitals Group     BP 09/28/24 0341 (!) 132/91     Girls Systolic BP Percentile --      Girls Diastolic BP Percentile --      Boys Systolic BP Percentile --      Boys Diastolic BP Percentile --      Pulse Rate 09/28/24 0341 (!) 58     Resp 09/28/24 0341 18     Temp 09/28/24 0341 97.9 F (36.6 C)     Temp Source 09/28/24 0341 Oral     SpO2 09/28/24 0341 100 %     Weight 09/28/24 0338 230 lb (104.3 kg)     Height 09/28/24 0338 6' (1.829 m)     Head Circumference --      Peak Flow --      Pain Score 09/28/24 0338 2     Pain Loc --      Pain Education --      Exclude from Growth Chart --     Most recent  vital signs: Vitals:   09/28/24 0341 09/28/24 0430  BP: (!) 132/91 124/87  Pulse: (!) 58 (!) 58  Resp: 18   Temp: 97.9 F (36.6 C)   SpO2: 100% 99%    General: Awake, no distress.  CV:  Good peripheral perfusion.  Resp:  Normal effort.  Abd:  No distention.  Other:  Resting comfortably pleasant gentleman no acute distress.  Normotensive, and equal blood pressures on both arms.  No chest wall tenderness or rashes noted to the chest, no heart murmurs, and clear chest auscultation bilaterally.  Able to range both arms with full active range of motion no sensory deficits.  Warm well-perfused bilateral upper extremities with good intact equal radial pulses on both sides.  Soft benign abdominal exam deep palpation all quadrants.  Performed neurologic exam as well-sensory intact bilateral upper and lower extremities and face no dysarthria no facial asymmetry and strength intact throughout.  ED Results / Procedures / Treatments   Labs (all labs ordered are listed, but only abnormal results are displayed) Labs Reviewed  BASIC  METABOLIC PANEL WITH GFR - Abnormal; Notable for the following components:      Result Value   Glucose, Bld 107 (*)    All other components within normal limits  CBC  HEPATIC FUNCTION PANEL  TSH  T4, FREE  TROPONIN I (HIGH SENSITIVITY)  TROPONIN I (HIGH SENSITIVITY)     I ordered and reviewed the above labs they are notable for cell counts unremarkable  EKG  ED ECG REPORT I, Ginnie Shams, the attending physician, personally viewed and interpreted this ECG.   Date: 09/28/2024  EKG Time: 0348  Rate: 56  Rhythm: sinus  Axis: nl  Intervals:nl  ST&T Change: no stemi    RADIOLOGY I independently reviewed and interpreted chest x-ray and I see no obvious focality or pneumothorax I also reviewed radiologist's formal read.   PROCEDURES:  Critical Care performed: No  Procedures   MEDICATIONS ORDERED IN ED: Medications  aspirin  chewable tablet 324 mg  (324 mg Oral Given 09/28/24 0400)  iohexol  (OMNIPAQUE ) 350 MG/ML injection 100 mL (100 mLs Intravenous Contrast Given 09/28/24 0456)     IMPRESSION / MDM / ASSESSMENT AND PLAN / ED COURSE  I reviewed the triage vital signs and the nursing notes.                                Patient's presentation is most consistent with acute presentation with potential threat to life or bodily function.  Differential diagnosis includes, but is not limited to, ACS, dissection, musculoskeletal pain, costochondritis, pneumothorax, respiratory infection considered but less likely upper abdominal problems   The patient is on the cardiac monitor to evaluate for evidence of arrhythmia and/or significant heart rate changes.  MDM:    Concern for ACS given the chest pressure with radiation to left arm sudden onset at night.  Will give aspirin .  Initial EKG without ischemic changes.  Will check serial troponins.  No pulse deficits and looks remarkably comfortable currently so I doubt dissection or other aortic catastrophes.  However he does have a history of ascending aortic aneurysm and has an upcoming CT angiogram for monitoring, I will get that test done today to rule out aortic emergencies like dissection that would account for his pain.  Regarding the pain in the left elbow and tingling sensation down through the fingertips I considered neurologic emergencies like stroke but would be a very odd distribution and constellation of symptoms to represent stroke.  Furthermore he has no focal neurologic deficits currently.  I doubt stroke.  He is fully compliant with his Eliquis  I doubt PE.  - Patient resting comfortably asymptomatic now.  Chest pain-free.  Serial troponins negative and CT angiogram shows no emergency conditions, incidental findings only, relay these findings to patient for follow-up as needed.  Considered admission but given unremarkable evaluation as above, asymptomatic currently, and has  adequate follow-up I think he is most appropriate for discharge and outpatient follow-up with strict return precautions at this time        FINAL CLINICAL IMPRESSION(S) / ED DIAGNOSES   Final diagnoses:  Nonspecific chest pain  Paresthesia and pain of left extremity  Pulmonary nodule  Hiatal hernia     Rx / DC Orders   ED Discharge Orders     None        Note:  This document was prepared using Dragon voice recognition software and may include unintentional dictation errors.    Shams Ginnie, MD  09/28/24 0623  

## 2024-09-28 NOTE — ED Triage Notes (Signed)
 Patient ambulatory to triage with steady gait, without difficulty or distress noted; pt reports tingling and pain to left arm with dull pain to left side chest upon awakening PTA; denies hx of same; no meds taken PTA, no accomp symptoms

## 2024-09-28 NOTE — Discharge Instructions (Addendum)
 Fortunately your evaluation in the emergency department did not show any emergency conditions like heart attack, dissection of the aorta, or stroke to account for your symptoms last night.  Please follow-up with your primary doctor and your cardiologist for an appointment.  When you meet with your primary doctor, have them review your imaging from today for incidental findings including hiatal hernia and pulmonary nodules for further testing and evaluation only as needed.  These are not new findings as they have shown up in your previous testing and looks similar today.   Thank you for choosing us  for your health care today!  Please see your primary doctor this week for a follow up appointment.   If you have any new, worsening, or unexpected symptoms call your doctor right away or come back to the emergency department for reevaluation.  It was my pleasure to care for you today.   Ginnie EDISON Cyrena, MD

## 2024-11-24 ENCOUNTER — Telehealth: Payer: Self-pay

## 2024-11-24 NOTE — Telephone Encounter (Signed)
 Copied from CRM #[id number]. Topic: Appointments - Transfer of Care >> Nov 24, 2024  1:54 PM Antwanette L wrote: Pt is requesting to transfer FROM: Dr. Jerona Reusing Pt is requesting to transfer TO: Dr. Parris Juneau Reason for requested transfer: Starting in January Dr. Reusing will not be in network with the patient insurance provider It is the responsibility of the team the patient would like to transfer to (Dr. Clarissa Zafirov) to reach out to the patient if for any reason this transfer is not acceptable.

## 2024-12-19 NOTE — Progress Notes (Signed)
 John Pineda is a 63 y.o. male seen in follow up. He was last seen on 06/28/2024. He was seen today by telehealth. He did not come in today as he is feeling unwell.  History: He has a history of a pituitary macroprolactinoma, status post endoscopic transnasal resection in 08/2010. Tumor size was 2.74 x 2.23 x 2.36 cm. The postop CT scan showed a hyper attenuating area which may have represented a combination of residual neoplasm or pituitary tissue. His prolactin levels postop measured 35 - 43 ng/ml. He briefly was on cabergoline post-op, however could not tolerate it so was started on bromocriptine  for persistent hyperprolactinemia. A follow up MRI in 2015 showed a small amount of enhancing tissue on the left aspect of the pituitary felt to represent residual pituitary tissue. A follow up brain MRI in 11/2020 showed no residual adenoma.                                                            Due to persistently low prolactin of 0.6 ng/ml on low dose bromocriptine , I stopped his bromocriptine  in 04/2019. A follow up prolactin level in 06/2019 was 9.6 ng/ml. Another follow up prolactin level on 05/01/20 was elevated at 22.7 ng/ml and bromocriptine  was restarted. Since that time, prolactin levels have been <10 ng/ml.  He has secondary hypothyroidism managed with generic levothyroxine  100 mcg daily and and secondary hypogonadism managed with IM testosterone 100 mg q14 days.  Recent labs were drawn on 12/15/24 which was only 3 days after he had labs drawn. His testosterone level was high at 1088 ng/dl and free T4 was in normal range.  He has prediabetes. Hb A1c is now 6.1%, which is stable from prior.  Past Medical History:  Diagnosis Date   Atrial fibrillation (CMS/HHS-HCC)    Chickenpox    Food intolerance 2015   Mild intolerance to lowfat milk.  Other dairy products are not an issue.   GERD (gastroesophageal reflux disease) 09/23/2011   Gynecomastia    On right, documented by biopsy.    History of chest pain 10/22/2003   With normal stress echo 11/04.   History of esophageal stricture    Noted on EGD 01/12/2006.   History of respiratory tract infection 11/30/2024   Hyperlipidemia 09/23/2011   Pituitary adenoma (CMS/HHS-HCC) 08/21/2010   Status post resection at [facility] 9/11.   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   Sleep apnea      Outpatient Medications Marked as Taking for the 12/19/24 encounter (Telemedicine) with Solum, Anna Melissa, MD  Medication Sig Dispense Refill   acetaminophen  (TYLENOL ) 325 MG tablet Take 650 mg by mouth every 4 (four) hours as needed for Pain     albuterol  MDI, PROVENTIL , VENTOLIN , PROAIR , HFA 90 mcg/actuation inhaler Inhale 2 Inhalations into the lungs every 6 (six) hours as needed     atorvastatin (LIPITOR) 20 MG tablet Take 1 tablet (20 mg total) by mouth once daily (Patient taking differently: Take 20 mg by mouth at bedtime) 90 tablet 3   bromocriptine  (PARLODEL ) 2.5 mg tablet TAKE ONE-HALF TABLET 5 DAYS PER WEEK ONLY. 32 tablet 1   budesonide -formoteroL  (SYMBICORT ) 160-4.5  mcg/actuation inhaler Inhale 2 inhalations into the lungs 2 (two) times daily 10.2 g 1   digoxin  (LANOXIN ) 0.125 MG tablet TAKE 1 TABLET BY MOUTH EVERY DAY 90 tablet 4   ELIQUIS  5 mg tablet TAKE 1 TABLET BY MOUTH TWICE A DAY 60 tablet 8   fluticasone propionate (FLONASE) 50 mcg/actuation nasal spray Place 1 spray into both nostrils 2 (two) times daily as needed for Rhinitis 16 g 11   gabapentin  (NEURONTIN ) 300 MG capsule Take 1 capsule (300 mg total) by mouth at bedtime Takes 1 Daily 90 capsule 3   ipratropium (ATROVENT) 21 mcg (0.03 %) nasal spray Place 1 spray into both nostrils 2 (two) times daily as needed for Rhinitis 30 mL 11   levalbuterol (XOPENEX HFA) inhaler Inhale 2 inhalations into  the lungs every 4 (four) hours as needed for Wheezing 15 g 1   levothyroxine  (SYNTHROID ) 100 MCG tablet Take 1 tablet (100 mcg total) by mouth once daily Take on an empty stomach with a glass of water at least 30-60 minutes before breakfast. 90 tablet 3   multivitamin tablet Take 1 tablet by mouth once daily     omeprazole (PRILOSEC) 20 MG DR capsule TAKE 1 CAPSULE BY MOUTH ONCE A DAY. AFTER 30 DAYS, CAN RESUME PREVIOUS DOSE 90 capsule 1   testosterone cypionate (DEPO-TESTOSTERONE) 200 mg/mL injection INJECT 1 ML (200 MG TOTAL) INTO THE MUSCLE EVERY 14 DAYS 6 mL 1     Exam Ht 182.9 cm (6')   Wt (!) 102.1 kg (225 lb)   BMI 30.52 kg/m   GEN: well developed male in NAD. PSYC: alert and oriented, good insight.  Labs Appointment on 12/15/2024  Component Date Value Ref Range Status   Thyroxine, Free (FT4) 12/15/2024 0.81  0.66 - 1.14 ng/dL Final   Prolactin - [facility] 12/15/2024 1.1 (L)  3.6 - 25.2 ng/mL Final   Glucose 12/15/2024 97  70 - 110 mg/dL Final   Sodium 87/73/7974 136  136 - 145 mmol/L Final   Potassium 12/15/2024 4.2  3.6 - 5.1 mmol/L Final   Chloride 12/15/2024 100  97 - 109 mmol/L Final   Carbon Dioxide (CO2) 12/15/2024 29.9  22.0 - 32.0 mmol/L Final   Calcium 12/15/2024 9.8  8.7 - 10.3 mg/dL Final   Urea Nitrogen (BUN) 12/15/2024 16  7 - 25 mg/dL Final   Creatinine 87/73/7974 0.9  0.7 - 1.3 mg/dL Final   Glomerular Filtration Rate (eGFR) 12/15/2024 95  >60 mL/min/1.73sq m Final   CKD-EPI (2021) does not include patient's race in the calculation of eGFR.  Monitoring changes of plasma creatinine and eGFR over time is useful for monitoring kidney function.   Interpretive Ranges for eGFR (CKD-EPI 2021):  eGFR:       >60 mL/min/1.73 sq. m - Normal eGFR:       30-59 mL/min/1.73 sq. m - Moderately Decreased eGFR:       15-29 mL/min/1.73 sq. m  - Severely Decreased eGFR:       < 15 mL/min/1.73 sq. m  - Kidney Failure    Note: These eGFR calculations do not apply  in acute situations when eGFR is changing rapidly or patients on dialysis.   BUN/Crea Ratio 12/15/2024 17.8  6.0 - 20.0 Final   Anion Gap w/K 12/15/2024 10.3  6.0 - 16.0 Final   Hemoglobin A1C 12/15/2024 6.1 (H)  4.2 - 5.6 % Final   Average Blood Glucose (Calc) 12/15/2024 128  mg/dL Final   Testosterone, Total, LC/MS -  [facility]* 12/15/2024 1088.4 (H)  264.0 - 916.0 ng/dL Final   This [facility] LC/MS-MS method is currently certified by the [facility] Hormone Standardization Program (HoSt). Adult male reference interval is based on a population of healthy nonobese males (BMI <30) between 17 and 12 years old. Travison, et.al. JCEM 303 [id number]. PMID: 71675896.     Assessment/Plan 1. Prolactinoma with persistent hyperprolactinemia status post endoscopic transnasal resection of prolactinoma in 2011.  - Prolactin in appropriate suppressed. Continue bromocriptine .             2. Secondary hypothyroidism due to prior pituitary tumor and pituitary surgery.  - Reassured that hypothyroidism is adequately treated. Continue levothyroxine  100 mcg daily.    3. Secondary hypogonadism due to prior pituitary tumor and pituitary surgery. Here for therapeutic monitoring.  - Hypogonadism is NOT due to aging. This is due to pituitary tumor / tumor surgery.  - Recent testosterone level obtained only 3 days after taking a dose of testosterone cypionate. While it was high, it reflects a peak not a mid dose level. Counseled him to have testosterone level repeated after 2-3 days. Will mail lab order as he prefers this be done at [facility]. He is to call to request results if he has not heard back by one week after it is drawn.   4. Prediabetes.  - Mild, stable. Continue monitoring.   Follow up in 6 months or sooner if needed with labs prior.   --------------------  This video encounter was conducted with the patient's (or proxy's) verbal consent via secure, interactive audio and video telecommunications while in  clinic/office/hospital.  The patient (or proxy) was instructed to have this encounter in a suitably private space and to only have persons present to whom they give permission to participate. In addition, patient identity was confirmed by use of name plus an additional identifier.  This visit was coded based on medical decision making (MDM).

## 2024-12-26 ENCOUNTER — Ambulatory Visit: Payer: Self-pay

## 2024-12-26 VITALS — BP 142/84 | HR 67 | Ht 72.0 in | Wt 235.2 lb

## 2024-12-26 DIAGNOSIS — J4540 Moderate persistent asthma, uncomplicated: Secondary | ICD-10-CM | POA: Diagnosis not present

## 2024-12-26 DIAGNOSIS — I7121 Aneurysm of the ascending aorta, without rupture: Secondary | ICD-10-CM | POA: Diagnosis not present

## 2024-12-26 DIAGNOSIS — J849 Interstitial pulmonary disease, unspecified: Secondary | ICD-10-CM

## 2024-12-26 DIAGNOSIS — K219 Gastro-esophageal reflux disease without esophagitis: Secondary | ICD-10-CM | POA: Diagnosis not present

## 2024-12-26 DIAGNOSIS — Z8719 Personal history of other diseases of the digestive system: Secondary | ICD-10-CM

## 2024-12-26 DIAGNOSIS — I4891 Unspecified atrial fibrillation: Secondary | ICD-10-CM

## 2024-12-26 DIAGNOSIS — R7303 Prediabetes: Secondary | ICD-10-CM | POA: Diagnosis not present

## 2024-12-26 DIAGNOSIS — G4733 Obstructive sleep apnea (adult) (pediatric): Secondary | ICD-10-CM

## 2024-12-26 DIAGNOSIS — R351 Nocturia: Secondary | ICD-10-CM

## 2024-12-26 DIAGNOSIS — E038 Other specified hypothyroidism: Secondary | ICD-10-CM

## 2024-12-26 DIAGNOSIS — Z8619 Personal history of other infectious and parasitic diseases: Secondary | ICD-10-CM | POA: Diagnosis not present

## 2024-12-26 DIAGNOSIS — D352 Benign neoplasm of pituitary gland: Secondary | ICD-10-CM | POA: Diagnosis not present

## 2024-12-26 MED ORDER — PREDNISONE 20 MG PO TABS: 40.0000 mg | ORAL_TABLET | Freq: Every day | ORAL | 0 refills | Status: DC

## 2024-12-26 NOTE — Progress Notes (Signed)
 "   New Patient Visit   Physician: Veleria Barnhardt A Tiffany Calmes, MD  Patient: John Pineda.   DOB: 03/22/1961   64 y.o. Male  MRN: [id number] Visit Date: 12/26/2024   Chief Complaint  Patient presents with   Establish Care   Subjective  [name]. is a 64 y.o. male who presents today as a new patient to establish care.   HPI  Discussed the use of AI scribe software for clinical note transcription with the patient, who gave verbal consent to proceed.  History of Present Illness   John Pineda. [name] is a 64 year old male who presents to establish care.  Upper and lower respiratory symptoms - Persistent congestion and cough for one week duration - Received steroid injection for worsening infection with partial relief - Continues to have congestion and cough despite intervention - Uses neti pot regularly with production of thick, sticky mucus - Wheezing and shortness of breath, especially when supine - Increased use of albuterol  inhaler over the past week  Asthma, bronchiectasis, and interstitial lung disease - History of moderate persistent asthma, bronchiectasis, and early chronic interstitial lung disease - Uses levalbuterol as needed, ipratropium nasal spray, and Symbicort  160/4.5 mcg - Family history of pulmonary fibrosis (father and grandmother) - No genetic marker for pulmonary fibrosis identified - CT angiogram performed in October 2025  - Follow with pulmonology  Obstructive sleep apnea - Diagnosed with obstructive sleep apnea - Uses CPAP regularly  Pituitary prolactinoma and endocrine dysfunction - hypogonadism. hypothyroidism - History of pituitary prolactinoma, status post endoscopic transnasal resection in September 2011 - On bromocriptine  therapy - Secondary hypothyroidism managed with levothyroxine  100 mcg daily.  Clincally euthyroid - Secondary hypogonadism managed with intramuscular testosterone 100 mg every 14 days - Marked sensitivity to  temperature changes with narrow comfort range - Excessive sweating in summer and cold intolerance in winter  Cardiovascular disease - Atrial fibrillation.  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   Secondary hypothyroidism Yes   Pituitary adenoma (HCC)    OSA (obstructive sleep apnea)    Gastroesophageal reflux disease without esophagitis    Atrial fibrillation with rapid ventricular response (HCC)    Prediabetes    Interstitial lung disease (HCC)    Moderate persistent asthma, unspecified whether complicated    Aneurysm of ascending aorta without rupture    History of hiatal hernia    History of Epstein-Barr virus infection     Orders Placed This Encounter  Procedures   CBC with Differential/Platelet   Comprehensive metabolic panel with GFR  Hemoglobin A1c   Lipid panel   Urinalysis, Routine w reflex microscopic   Hemoglobin A1c    Assessment and Plan    Chronic  interstitial lung disease with bronchiectasis and moderate persistent asthma Recent exacerbation with increased congestion, cough, and shortness of breath. Discussed potential use of prednisone  and Mucinex. - Prescribed prednisone  for exacerbation management. - Increased inhaler usage to every four hours. - Recommended Mucinex for mucus management. - Advised follow-up if symptoms worsen or fever develops.  Atrial fibrillation, status post ablation Paroxysmal atrial fibrillation, status post two ablations. Currently on Eliquis . Recent ablation in December 2025. - Continue Eliquis  5 mg twice daily. - Follow up with cardiology in June.  Stable  Obstructive sleep apnea Managed with CPAP. - Continue CPAP therapy.  Aneurysm of ascending aorta Abdominal aortic aneurysm measuring 4.1 cm. Annual monitoring in place. - Continue annual monitoring of aneurysm.  Pituitary prolactinoma, status post resection Status post endoscopic transnasal resection in September 2011. Currently on bromocriptine . Followed by endocrinology. - Continue bromocriptine . - Continue follow-up with endocrinology.  Secondary hypothyroidism Managed with levothyroxine  100 mcg daily. Reports sensitivity to temperature changes, possibly related to pituitary history. - Continue levothyroxine  100 mcg daily.  Secondary hypogonadism Managed with intramuscular testosterone. Followed by endocrinology. Recent lab timing issue noted with testosterone levels. - Continue intramuscular testosterone therapy. - Will coordinate testosterone level check with endocrinology.  Prediabetes Recent hemoglobin A1c of 6.1. Recent labs drawn at the end of December. - Ordered repeat hemoglobin A1c with upcoming labs.  Lower urinary tract symptoms Including straining, intermittent stream, and nocturia. Symptoms progressive over 2-3 years. No prior urology evaluation. - Referred to urology for evaluation of lower urinary tract symptoms.     Right knee pain, acute Acute right knee pain with sharp pain on standing and sore spot on joint line. Likely due to strain or meniscus issue. - Advised rest and monitoring of symptoms. - Instructed to follow up if pain persists or worsens.  General health maintenance Discussed vaccinations and travel precautions. Recent flu, pneumonia, RSV, and COVID vaccinations received. - Continue routine vaccinations. - Advised wearing a mask during flights.      Objective  BP (!) 142/84 (BP Location: Left Arm, Patient Position: Sitting, Cuff Size: Normal)   Pulse 67   Ht 6' (1.829 m)   Wt 235 lb 4 oz (106.7 kg)   SpO2 98%   BMI 31.91 kg/m      Review of Systems  Constitutional:  Negative for chills, fever and weight loss.  Eyes:  Negative for blurred vision. h Respiratory:  Negative for cough and shortness of breath.   Cardiovascular:  Negative for chest pain and palpitations.  Skin:  Negative for rash.  Psychiatric/Behavioral:  Negative for depression. The patient is not nervous/anxious.      Physical Exam Physical Exam Vitals reviewed.  Constitutional:      Appearance: Normal appearance. Well-developed with normal weight.  HENT:     Head: Normocephalic and atraumatic.  Normal mucous membranes, no oral lesions Eyes:     Pupils: Pupils are equal, round, and reactive to light.  Neck:     Thyroid : No thyroid  mass or thyromegaly.  Cardiovascular:     Rate and Rhythm: Normal rate and regular rhythm. Normal heart sounds. Normal peripheral pulses Pulmonary:     Mildly coarse breath sounds, normal work of breathing, SaO2 98%, no wheezing Abdominal:   Abdomen is soft, without tenderness or noted hepatosplenomegaly Musculoskeletal:        General:  No swelling or edema  Lymphadenopathy:     Cervical: No cervical adenopathy.  Skin:    General: Skin is warm and dry without noticeable rash. Neurological:     General: No focal deficit present.  Psychiatric:        Mood and  Affect: Mood, behavior and cognition normal   Past Medical History:  Diagnosis Date   Esophageal stricture    GERD (gastroesophageal reflux disease)    Gynecomastia, male    History of Epstein-Barr virus infection 2008   History of secondary hypogonadism    Hypothyroidism    Pituitary adenoma (HCC) 2011   S/P resection   Plantar fasciitis    Pre-diabetes    RLS (restless legs syndrome)    Shortness of breath/respiratory failure with hypoxia 12/04/2021   Sleep apnea    No CPAP   Past Surgical History:  Procedure Laterality Date   BREAST BIOPSY Right    BUNIONECTOMY Right 12/2008   Great toe   COLONOSCOPY WITH ESOPHAGOGASTRODUODENOSCOPY (EGD)     2008, 2018   FRACTURE SURGERY Right    Collarbone   PARS PLANA VITRECTOMY Right 02/07/2019   PITUITARY EXCISION  2011   UVULOPALATOPHARYNGOPLASTY     Family Status  Relation Name Status   Mother  Deceased   Father  Deceased  No partnership data on file   History reviewed. No pertinent family history. Social History   Socioeconomic History   Marital status: Married    Spouse name: Not on file   Number of children: Not on file   Years of education: Not on file   Highest education level: Not on file  Occupational History   Not on file  Tobacco Use   Smoking status: Never   Smokeless tobacco: Never  Vaping Use   Vaping status: Never Used  Substance and Sexual Activity   Alcohol use: Yes    Alcohol/week: 2.0 standard drinks of alcohol    Types: 2 Glasses of wine per week    Comment: occasional drink   Drug use: Never   Sexual activity: Not on file  Other Topics Concern   Not on file  Social History Narrative   Lives locally w/ wife.  [place] - very active.  Rides mountain bike fairly regularly w/o limitations.   Social Drivers of Health   Tobacco Use: Low Risk (12/26/2024)   Patient History    Smoking Tobacco Use: Never    Smokeless Tobacco Use: Never    Passive Exposure: Not on file  Financial Resource Strain:  Low Risk  (10/31/2024)   Received from [facility]   Overall Financial Resource Strain (CARDIA)    Difficulty of Paying Living Expenses: Not hard at all  Food Insecurity: No Food Insecurity (10/31/2024)   Received from [facility]   Epic    Within the past 12 months, you worried that your food would run out before you got the money to buy more.: Never true    Within the past 12 months, the food you bought just didn't last and you didn't have money to get more.: Never true  Transportation Needs: No Transportation Needs (10/31/2024)   Received from [facility] - Transportation    In the past 12 months, has lack of transportation kept you from medical appointments or from getting medications?: No    Lack of Transportation (Non-Medical): No  Physical Activity: Not on file  Stress: Not on file  Social Connections: Not  on file  Depression (EYV7-0): Not on file  Alcohol Screen: Not on file  Housing: Low Risk  (10/31/2024)   Received from [facility]   Epic    In the last 12 months, was there a time when you were not able to pay the mortgage or rent on time?: No    In the past 12 months, how many times have you moved where you were living?: 0    At any time in the past 12 months, were you homeless or living in a shelter (including now)?: No  Utilities: Not At Risk (10/31/2024)   Received from [facility]   Epic    In the past 12 months has the electric, gas, oil, or water company threatened to shut off services in your home?: No  Health Literacy: Not on file   Show/hide medication list[1] Allergies[2]  Immunization History  Administered Date(s) Administered   PFIZER Comirnaty(Gray Top)Covid-19 Tri-Sucrose Vaccine 03/21/2020, 04/15/2020   Pfizer Covid-19 Vaccine Bivalent Booster 64yrs & up 09/25/2021   Pfizer(Comirnaty)Fall Seasonal Vaccine 12 years and older 09/05/2024   Unspecified  SARS-COV-2 Vaccination 05/22/2021    Health Maintenance  Topic Date Due   Hepatitis C Screening  Never done   DTaP/Tdap/Td (1 - Tdap) Never done   Pneumococcal Vaccine: 50+ Years (1 of 2 - PCV) Never done   Colonoscopy  Never done   Hepatitis B Vaccines 19-59 Average Risk (3 of 3 - 19+ 3-dose series) 10/31/2024   COVID-19 Vaccine (8 - Mixed Product risk 2025-26 season) 03/05/2025   Influenza Vaccine  Completed   HIV Screening  Completed   Zoster Vaccines- Shingrix  Completed   HPV VACCINES  Aged Out   Meningococcal B Vaccine  Aged Out    Patient Care Team: Diedra Lame, MD as PCP - General (Family Medicine) End, Lonni, MD as PCP - Cardiology (Cardiology)  Depression Screen     No data to display           Parris DELENA Juneau, MD  [facility] [phone] (phone) [phone] (fax)  [facility]    [1]  Outpatient Medications Prior to Visit  Medication Sig   albuterol  (VENTOLIN  HFA) 108 (90 Base) MCG/ACT inhaler Inhale 2 puffs into the lungs every 6 (six) hours as needed for wheezing or shortness of breath.   apixaban  (ELIQUIS ) 5 MG TABS tablet Take 1 tablet (5 mg total) by mouth 2 (two) times daily.   atorvastatin (LIPITOR) 20 MG tablet Take 20 mg by mouth daily.   bromocriptine  (PARLODEL ) 2.5 MG tablet Take 1.25 mg by mouth daily.   budesonide -formoterol  (SYMBICORT ) 80-4.5 MCG/ACT inhaler Inhale 2 puffs into the lungs 2 (two) times daily.   gabapentin  (NEURONTIN ) 300 MG capsule Take 600 mg by mouth daily.   levothyroxine  (SYNTHROID ) 100 MCG tablet Take 100 mcg by mouth daily before breakfast.   omeprazole (PRILOSEC) 20 MG capsule Take 20 mg by mouth daily.   testosterone cypionate (DEPOTESTOSTERONE CYPIONATE) 200 MG/ML injection Inject 200 mg into the muscle every 14 (fourteen) days.   simvastatin  (ZOCOR ) 20 MG tablet Take 20 mg by mouth daily. (Patient not taking: Reported on 12/26/2024)   No facility-administered  medications prior to visit.  [2]  Allergies Allergen Reactions   Milk (Cow) Diarrhea   "

## 2024-12-27 ENCOUNTER — Telehealth: Payer: Self-pay

## 2024-12-27 ENCOUNTER — Other Ambulatory Visit: Payer: Self-pay

## 2024-12-27 DIAGNOSIS — E038 Other specified hypothyroidism: Secondary | ICD-10-CM

## 2024-12-27 DIAGNOSIS — K219 Gastro-esophageal reflux disease without esophagitis: Secondary | ICD-10-CM

## 2024-12-27 DIAGNOSIS — G4733 Obstructive sleep apnea (adult) (pediatric): Secondary | ICD-10-CM

## 2024-12-27 DIAGNOSIS — D352 Benign neoplasm of pituitary gland: Secondary | ICD-10-CM

## 2024-12-27 DIAGNOSIS — J4540 Moderate persistent asthma, uncomplicated: Secondary | ICD-10-CM

## 2024-12-27 DIAGNOSIS — I4891 Unspecified atrial fibrillation: Secondary | ICD-10-CM

## 2024-12-27 DIAGNOSIS — Z8619 Personal history of other infectious and parasitic diseases: Secondary | ICD-10-CM

## 2024-12-27 DIAGNOSIS — R7303 Prediabetes: Secondary | ICD-10-CM

## 2024-12-27 DIAGNOSIS — J849 Interstitial pulmonary disease, unspecified: Secondary | ICD-10-CM

## 2024-12-27 DIAGNOSIS — I7121 Aneurysm of the ascending aorta, without rupture: Secondary | ICD-10-CM

## 2024-12-27 DIAGNOSIS — Z8719 Personal history of other diseases of the digestive system: Secondary | ICD-10-CM

## 2024-12-27 MED ORDER — PREDNISONE 20 MG PO TABS: 40.0000 mg | ORAL_TABLET | Freq: Every day | ORAL | 0 refills | Status: AC

## 2024-12-27 NOTE — Addendum Note (Signed)
 Addended by: ZELIA GAUZE D on: 12/27/2024 09:43 AM   Modules accepted: Orders

## 2024-12-27 NOTE — Telephone Encounter (Signed)
"  Patient advised  "

## 2024-12-27 NOTE — Telephone Encounter (Signed)
 Copied from CRM [id number]. Topic: Clinical - Prescription Issue >> Dec 27, 2024  9:09 AM Larissa RAMAN wrote: Reason for CRM: Patient states instructions for prednisone  conflict with instructions in received during visit and the quantity he received from pharmacy. Patient is requesting a callback as soon as possible.

## 2025-01-04 ENCOUNTER — Ambulatory Visit

## 2025-01-04 VITALS — BP 117/82 | HR 56 | Temp 97.6°F | Resp 12 | Ht 72.0 in | Wt 234.4 lb

## 2025-01-04 DIAGNOSIS — R7689 Other specified abnormal immunological findings in serum: Secondary | ICD-10-CM | POA: Diagnosis not present

## 2025-01-04 DIAGNOSIS — J849 Interstitial pulmonary disease, unspecified: Secondary | ICD-10-CM

## 2025-01-04 DIAGNOSIS — Z1159 Encounter for screening for other viral diseases: Secondary | ICD-10-CM | POA: Diagnosis not present

## 2025-01-04 NOTE — Patient Instructions (Signed)
 [facility] [address] [place], [place] [address] [phone]

## 2025-01-04 NOTE — Progress Notes (Signed)
 "  Office Visit Note  Patient: John Pineda.             Date of Birth: April 05, 1961           MRN: [id number]             PCP: Diedra Lame, MD Referring: Parris Manna, MD Visit Date: 01/04/2025 Occupation: Data Unavailable  Subjective:  New Patient (Initial Visit) (Abnormal Labs, ILD, Right knee pain and complains of pain in his great toes.)   History of Present Illness: John Pineda. is a 64 y.o. male    Discussed the use of AI scribe software for clinical note transcription with the patient, who gave verbal consent to proceed.  History of Present Illness John Pineda. [name] is a 64 year old male who presents with concerns about rheumatoid arthritis and its potential link to interstitial lung disease. He was referred by his pulmonologist due to concerns about a high marker for rheumatoid arthritis and its potential link to interstitial lung disease.  He has a family history of pulmonary fibrosis, with his father, grandmother, and great aunt all having died from the condition. His father was a long-time cigar smoker and worked in hazardous environments without protection.  He has a history of Elbert Shine virus, with at least three extended episodes between 2008 and 2018, each lasting from a month to nearly a year. During these flares, he experienced significant joint pain, describing it as feeling like 'the Tin Man in the Wizard of Oz,' with his body feeling seized up and painful to move. These episodes were spread out over the years and not frequent. He has not had an episode since 2018.  He experiences joint pain, particularly in his knees, which is sore upon standing from rest but improves with movement. He has not had any significant knee injuries or surgeries. He also reports occasional pain in his elbows during activities like pushups and has had surgery on both large toes due to issues likely stemming from plantar fasciitis. No consistent morning stiffness or  swelling in his hands.  He has a history of lung infections and was recently on a course of prednisone  for a lung infection, which improved his symptoms. He reports feeling jittery and having difficulty sleeping while on prednisone . He has a history of bronchiectasis and has experienced various respiratory infections, including COVID-19, RSV, and flu, often after traveling.  [place] a [place] of dust and farm exposure. He uses a respirator or dust mask when engaging in activities that stir up dust. He does not smoke but grew up in a household with a smoker. He has a history of working in stressful environments, which he believes may have contributed to his health issues.  He is currently on medications including gabapentin  for restless legs and Lipitor for cholesterol management. He recently underwent a second ablation for atrial fibrillation and is hopeful about reducing his use of Eliquis .    Activities of Daily Living:  Patient reports morning stiffness for 10-15 minutes.   Patient Denies nocturnal pain.  Difficulty dressing/grooming: Denies Difficulty climbing stairs: Denies Difficulty getting out of chair: Denies Difficulty using hands for taps, buttons, cutlery, and/or writing: Denies  Review of Systems  Constitutional:  Negative for fatigue.  HENT:  Positive for mouth dryness. Negative for mouth sores.   Eyes:  Negative for dryness.  Respiratory:  Positive for shortness of breath.   Cardiovascular:  Negative for chest  pain and palpitations.  Gastrointestinal:  Negative for blood in stool, constipation and diarrhea.  Endocrine: Negative for increased urination.  Genitourinary:  Negative for involuntary urination.  Musculoskeletal:  Positive for joint pain, joint pain and morning stiffness. Negative for gait problem, joint swelling, myalgias, muscle weakness, muscle tenderness and myalgias.  Skin:  Negative for color change, rash, hair loss and sensitivity to  sunlight.  Allergic/Immunologic: Positive for susceptible to infections.  Neurological:  Positive for dizziness. Negative for headaches.  Hematological:  Negative for swollen glands.  Psychiatric/Behavioral:  Positive for sleep disturbance. Negative for depressed mood. The patient is not nervous/anxious.     PMFS History:  Patient Active Problem List   Diagnosis Date Noted   Moderate persistent asthma 12/26/2024   History of hiatal hernia 12/26/2024   History of Epstein-Barr virus infection 12/26/2024   Nocturia 12/26/2024   Aneurysm of ascending aorta without rupture 07/15/2023   Interstitial lung disease (HCC) 07/15/2023   Pulmonary nodule 06/13/2023   Status post catheter ablation of atrial fibrillation 01/25/2023   OSA (obstructive sleep apnea) 02/02/2022   Paroxysmal atrial fibrillation (HCC) 12/23/2021   Atrial fibrillation (HCC) 12/03/2021   Atrial fibrillation with rapid ventricular response (HCC) 12/02/2021   GERD (gastroesophageal reflux disease)    Lactose intolerance 02/02/2019   Prediabetes 05/02/2018   Pituitary tumor 12/07/2012   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   Hyperlipidemia 09/23/2011   Restless leg syndrome 09/23/2011   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   Pituitary adenoma (HCC) 2011    Past Medical History:  Diagnosis Date   Atrial fibrillation (HCC)    BPH (benign prostatic hyperplasia)    Esophageal stricture    GERD (gastroesophageal reflux disease)    Gynecomastia, male    History of Epstein-Barr virus infection 2008   History of secondary hypogonadism    Hx of scarlet fever    Hypothyroidism    ILD (interstitial lung disease) (HCC)    Pituitary adenoma (HCC) 2011   S/P resection   Plantar fasciitis    Pre-diabetes    Retinal tear of right eye    RLS (restless legs syndrome)    Shortness of breath/respiratory failure with hypoxia 12/04/2021   Sleep apnea    No CPAP    Family History  Problem Relation Age of Onset   Pulmonary  fibrosis Father    Cancer Maternal Grandmother    Cancer Maternal Grandfather    Pulmonary fibrosis Paternal Grandmother    Cancer Paternal Grandfather    Past Surgical History:  Procedure Laterality Date   ATRIAL FIBRILLATION ABLATION     ATRIAL FIBRILLATION ABLATION     BREAST BIOPSY Right    BUNIONECTOMY Right 12/2008   Great toe   COLONOSCOPY WITH ESOPHAGOGASTRODUODENOSCOPY (EGD)     2008, 2018   FRACTURE SURGERY Right    Collarbone   PARS PLANA VITRECTOMY Right 02/07/2019   PITUITARY EXCISION  2011   Social History[1] Social History   Social History Narrative   Lives locally w/ wife.  [place] - very active.  Rides mountain bike fairly regularly w/o limitations.     Immunization History  Administered Date(s) Administered   PFIZER Comirnaty(Gray Top)Covid-19 Tri-Sucrose Vaccine 03/21/2020, 04/15/2020   Pfizer Covid-19 Vaccine Bivalent Booster 1yrs & up 09/25/2021   Pfizer(Comirnaty)Fall Seasonal Vaccine 12 years and older 09/05/2024   Unspecified SARS-COV-2 Vaccination 05/22/2021     Objective: Vital Signs: BP 117/82 (BP Location: Right Arm, Patient Position: Sitting, Cuff Size: Large)   Pulse (!) 56   Temp 97.6  F (36.4 C)   Resp 12   Ht 6' (1.829 m)   Wt 234 lb 6.4 oz (106.3 kg)   BMI 31.79 kg/m    Physical Exam Vitals and nursing note reviewed.  HENT:     Head: Normocephalic and atraumatic.     Nose: Nose normal.  Eyes:     Conjunctiva/sclera: Conjunctivae normal.     Pupils: Pupils are equal, round, and reactive to light.  Pulmonary:     Effort: Pulmonary effort is normal. No respiratory distress.  Musculoskeletal:     Comments: Synovial thickening bilateral hands. Right pes anserine bursa w/ mild tenderness  Skin:    General: Skin is warm and dry.  Neurological:     Mental Status: He is alert. Mental status is at baseline.  Psychiatric:        Mood and Affect: Mood normal.        Behavior: Behavior normal.      Musculoskeletal Exam:    CDAI Exam: CDAI Score: -- Patient Global: --; Provider Global: -- Swollen: 0 ; Tender: 0  Joint Exam 01/04/2025   All documented joints were normal     Investigation: No additional findings.  Imaging: No results found.  Recent Labs: Lab Results  Component Value Date   WBC 6.5 09/28/2024   HGB 13.7 09/28/2024   PLT 211 09/28/2024   NA 139 09/28/2024   K 3.9 09/28/2024   CL 105 09/28/2024   CO2 28 09/28/2024   GLUCOSE 107 (H) 09/28/2024   BUN 19 09/28/2024   CREATININE 0.95 09/28/2024   BILITOT 0.4 09/28/2024   ALKPHOS 89 09/28/2024   AST 22 09/28/2024   ALT 20 09/28/2024   PROT 7.2 09/28/2024   ALBUMIN 3.9 09/28/2024   CALCIUM 9.0 09/28/2024    Speciality Comments: No specialty comments available.  Procedures:  No procedures performed Allergies: Milk (cow)   Assessment / Plan:     Visit Diagnoses:   Rheumatoid factor positive ILD (interstitial lung disease) (HCC)   Patient with dx of ILD w/ borderline positive RF (14). As discussed with patient, RA can be associated with ILD, and the lung disease can occur prior to the onset of the arthritic component, however this is very rare. I would also expect the RF to be more than just a borderline elevation in this situation.   Discussed with patient that a borderline positive RF is extremely non-specific. Discussed that a rheumatoid factor is a nonspecific finding and can be seen in a number of rheumatologic and non-rheumatologic conditions including rheumatoid arthritis, sjogren's, mixed connective tissue disease, cryoglobulinemia, SLE and myositis. Indolent infections, endocarditis, hepatitis B and C are also associated with elevated RF. It can also be elevated in B-cell neoplasms and PBC. Rheumatoid factor can also be positive in normal individuals at a low titer (less than 50 IU) w/ a prevalence as high as 14% in apparently healthy people aged 66-95. Will obtain Hepatitis B core antibody, IgM, Hepatitis B surface  antigen, Hepatitis C antibody to rule out alternative causes. Will also repeat RF today and obtain CCP, a more specific test for RA.   Given synovial thickening on exam, unable to palpate joints fully. Will obtain DG Hand 2 View Right, DG Hand 2 View Left today.   Orders: Orders Placed This Encounter  Procedures   DG Hand 2 View Right   DG Hand 2 View Left   Alpha-1-antitrypsin   Rheumatoid Factor   Cyclic citrul peptide antibody, IgG   Hepatitis B  core antibody, IgM   Hepatitis B surface antigen   Hepatitis C antibody   No orders of the defined types were placed in this encounter.   I personally spent a total of 60 minutes in the care of the patient today including preparing to see the patient, getting/reviewing separately obtained history, performing a medically appropriate exam/evaluation, counseling and educating, placing orders, documenting clinical information in the EHR, and independently interpreting results.   Follow-Up Instructions: Return in about 4 weeks (around 02/01/2025).   Asberry Claw, DO  Note - This record has been created using Animal nutritionist.  Chart creation errors have been sought, but may not always  have been located. Such creation errors do not reflect on  the standard of medical care.        [1]  Social History Tobacco Use   Smoking status: Never    Passive exposure: Past   Smokeless tobacco: Never  Vaping Use   Vaping status: Never Used  Substance Use Topics   Alcohol use: Yes    Alcohol/week: 2.0 standard drinks of alcohol    Types: 2 Glasses of wine per week    Comment: occasional drink   Drug use: Yes    Comment: THC Gummies   "

## 2025-01-05 LAB — HEPATITIS B SURFACE ANTIGEN: Hepatitis B Surface Ag: NONREACTIVE

## 2025-01-05 LAB — RHEUMATOID FACTOR: Rheumatoid fact SerPl-aCnc: <10 [IU]/mL

## 2025-01-05 LAB — CYCLIC CITRUL PEPTIDE ANTIBODY, IGG: Cyclic Citrullin Peptide Ab: <16 U

## 2025-01-05 LAB — HEPATITIS B CORE ANTIBODY, IGM: Hep B C IgM: NONREACTIVE

## 2025-01-05 LAB — HEPATITIS C ANTIBODY: Hepatitis C Ab: NONREACTIVE

## 2025-01-05 LAB — ALPHA-1-ANTITRYPSIN: A-1 Antitrypsin, Ser: 132 mg/dL (ref 83–199)

## 2025-01-10 DIAGNOSIS — N400 Enlarged prostate without lower urinary tract symptoms: Secondary | ICD-10-CM | POA: Insufficient documentation

## 2025-01-10 NOTE — Assessment & Plan Note (Signed)
~  45 gland prostate, via CT estimation (2025)   Today we reviewed the physiology and common causes of male lower urinary tract symptoms (LUTS). Discussed potential etiologies including infectious, inflammatory, bladder-related, benign prostatic hyperplasia (BPH), and musculoskeletal/pelvic floor contributions. Reviewed the standard diagnostic workup (urinalysis, PVR, uroflow, prostate assessment, possible cystoscopy or imaging) and the spectrum of initial management strategies ranging from behavioral and lifestyle measures to pharmacologic therapy, with procedural options if indicated. All questions were addressed and the patient expressed understanding of the evaluation and treatment pathway.

## 2025-01-10 NOTE — Progress Notes (Unsigned)
" ° °  01/10/25 4:10 PM   John Pineda. [birth year] [id number]   HPI: 64 y.o. male here for initial evaluation of LUTS, nocturia  Onset/duration: {bglistvasectomytime:33394} Primary complaint: {bglistLUTStype:33836}   -{bglistLUTSprimarytype:33837} Degree of bother: {bglistPDdegreeofBother:33456} IPSS: ***  Current or prior therapies:   -***  Denies GH, UTIs, nephrolithiasis, prostatitis No prior GU surgeries*** Denies Fhx of GU malignancies   History of A-fib (on Eliquis ), ILD, asthma, OSA, RLS, pituitary macroprolactinoma s/p resection in 2011 History of low testosterone -on IM cypionate  - Followed by endocrinology, pulmonology, cardiology    PMH: Past Medical History:  Diagnosis Date   Atrial fibrillation (HCC)    BPH (benign prostatic hyperplasia)    Esophageal stricture    GERD (gastroesophageal reflux disease)    Gynecomastia, male    History of Epstein-Barr virus infection 2008   History of secondary hypogonadism    Hx of scarlet fever    Hypothyroidism    ILD (interstitial lung disease) (HCC)    Pituitary adenoma (HCC) 2011   S/P resection   Plantar fasciitis    Pre-diabetes    Retinal tear of right eye    RLS (restless legs syndrome)    Shortness of breath/respiratory failure with hypoxia 12/04/2021   Sleep apnea    No CPAP    Surgical History: Past Surgical History:  Procedure Laterality Date   ATRIAL FIBRILLATION ABLATION     ATRIAL FIBRILLATION ABLATION     BREAST BIOPSY Right    BUNIONECTOMY Right 12/2008   Great toe   COLONOSCOPY WITH ESOPHAGOGASTRODUODENOSCOPY (EGD)     2008, 2018   FRACTURE SURGERY Right    Collarbone   PARS PLANA VITRECTOMY Right 02/07/2019   PITUITARY EXCISION  2011    Family History: Family History  Problem Relation Age of Onset   Pulmonary fibrosis Father    Cancer Maternal Grandmother    Cancer Maternal Grandfather    Pulmonary fibrosis Paternal Grandmother    Cancer Paternal Grandfather     Social  History:  reports that he has never smoked. He has been exposed to tobacco smoke. He has never used smokeless tobacco. He reports current alcohol use of about 2.0 standard drinks of alcohol per week. He reports current drug use.      Physical Exam: There were no vitals taken for this visit.   Constitutional:  Alert and oriented, No acute distress. Cardiovascular: No clubbing, cyanosis, or edema. Respiratory: Normal respiratory effort, no increased work of breathing. GI: Nondistended Skin: No rashes, bruises or suspicious lesions. Neurologic: Grossly intact, no focal deficits, moving all 4 extremities. Psychiatric: Normal mood and affect.  Laboratory Data: Component Ref Range & Units 6 mo ago  PSA (Prostate Specific Antigen), Total 0.10 - 4.00 ng/mL 0.63     Pertinent Imaging: I personally reviewed his CT CAP from October 2025 -45 g prostate, otherwise morphologically normal-appearing bilateral kidneys ureters and bladder    Assessment & Plan:    There are no diagnoses linked to this encounter.    Penne Skye, MD 01/10/2025  [facility] [address] [place], [place] [address] [phone] "

## 2025-01-15 ENCOUNTER — Ambulatory Visit: Admitting: Urology

## 2025-01-15 DIAGNOSIS — N401 Enlarged prostate with lower urinary tract symptoms: Secondary | ICD-10-CM

## 2025-01-16 NOTE — Assessment & Plan Note (Signed)
~  45 gland prostate, via CT estimation (2025)   Today we reviewed the physiology and common causes of male lower urinary tract symptoms (LUTS). Discussed potential etiologies including infectious, inflammatory, bladder-related, benign prostatic hyperplasia (BPH), and musculoskeletal/pelvic floor contributions. Reviewed the standard diagnostic workup (urinalysis, PVR, uroflow, prostate assessment, possible cystoscopy or imaging) and the spectrum of initial management strategies ranging from behavioral and lifestyle measures to pharmacologic therapy, with procedural options if indicated. All questions were addressed and the patient expressed understanding of the evaluation and treatment pathway.

## 2025-01-16 NOTE — Progress Notes (Unsigned)
 "  01/16/25 7:34 AM   John Pineda. 1961-06-16 [id number]   HPI: 64 y.o. male here for initial evaluation of LUTS, nocturia  Onset/duration: {bglistvasectomytime:33394} Primary complaint: {bglistLUTStype:33836}   -{bglistLUTSprimarytype:33837} Degree of bother: {bglistPDdegreeofBother:33456} IPSS: ***  Current or prior therapies:   -***  Denies GH, UTIs, nephrolithiasis, prostatitis No prior GU surgeries*** Denies Fhx of GU malignancies   History of A-fib (on Eliquis ), ILD, asthma, OSA, RLS, pituitary macroprolactinoma s/p resection in 2011 History of low testosterone -on IM cypionate  - Followed by endocrinology, pulmonology, cardiology    PMH: Past Medical History:  Diagnosis Date   Atrial fibrillation (HCC)    BPH (benign prostatic hyperplasia)    Esophageal stricture    GERD (gastroesophageal reflux disease)    Gynecomastia, male    History of Epstein-Barr virus infection 2008   History of secondary hypogonadism    Hx of scarlet fever    Hypothyroidism    ILD (interstitial lung disease) (HCC)    Pituitary adenoma (HCC) 2011   S/P resection   Plantar fasciitis    Pre-diabetes    Retinal tear of right eye    RLS (restless legs syndrome)    Shortness of breath/respiratory failure with hypoxia 12/04/2021   Sleep apnea    No CPAP    Surgical History: Past Surgical History:  Procedure Laterality Date   ATRIAL FIBRILLATION ABLATION     ATRIAL FIBRILLATION ABLATION     BREAST BIOPSY Right    BUNIONECTOMY Right 12/2008   Great toe   COLONOSCOPY WITH ESOPHAGOGASTRODUODENOSCOPY (EGD)     2008, 2018   FRACTURE SURGERY Right    Collarbone   PARS PLANA VITRECTOMY Right 02/07/2019   PITUITARY EXCISION  2011    Family History: Family History  Problem Relation Age of Onset   Pulmonary fibrosis Father    Cancer Maternal Grandmother    Cancer Maternal Grandfather    Pulmonary fibrosis Paternal Grandmother    Cancer Paternal Grandfather     Social  History:  reports that he has never smoked. He has been exposed to tobacco smoke. He has never used smokeless tobacco. He reports current alcohol use of about 2.0 standard drinks of alcohol per week. He reports current drug use.      Physical Exam: There were no vitals taken for this visit.   Constitutional:  Alert and oriented, No acute distress. Cardiovascular: No clubbing, cyanosis, or edema. Respiratory: Normal respiratory effort, no increased work of breathing. GI: Nondistended Skin: No rashes, bruises or suspicious lesions. Neurologic: Grossly intact, no focal deficits, moving all 4 extremities. Psychiatric: Normal mood and affect.  Laboratory Data: Component Ref Range & Units 6 mo ago  PSA (Prostate Specific Antigen), Total 0.10 - 4.00 ng/mL 0.63     Pertinent Imaging: I personally reviewed his CT CAP from October 2025 -45 g prostate, otherwise morphologically normal-appearing bilateral kidneys ureters and bladder    Assessment & Plan:    Benign prostatic hyperplasia with lower urinary tract symptoms, symptom details unspecified Assessment & Plan: ~45 gland prostate, via CT estimation (2025)   Today we reviewed the physiology and common causes of male lower urinary tract symptoms (LUTS). Discussed potential etiologies including infectious, inflammatory, bladder-related, benign prostatic hyperplasia (BPH), and musculoskeletal/pelvic floor contributions. Reviewed the standard diagnostic workup (urinalysis, PVR, uroflow, prostate assessment, possible cystoscopy or imaging) and the spectrum of initial management strategies ranging from behavioral and lifestyle measures to pharmacologic therapy, with procedural options if indicated. All questions were addressed and the  patient expressed understanding of the evaluation and treatment pathway.        Penne Skye, MD 01/16/2025  [facility] [address], [address] [place], [place] [address] [phone] "

## 2025-01-24 ENCOUNTER — Ambulatory Visit: Admitting: Urology

## 2025-01-24 DIAGNOSIS — N401 Enlarged prostate with lower urinary tract symptoms: Secondary | ICD-10-CM

## 2025-01-26 NOTE — Progress Notes (Unsigned)
 "  Office Visit Note  Patient: John Pineda.             Date of Birth: [birth year]           MRN: [id number]             PCP: Diedra Lame, MD Referring: Diedra Lame, MD Visit Date: 02/08/2025 Occupation: Data Unavailable  Subjective:  No chief complaint on file.   History of Present Illness: John Mostafa. is a 64 y.o. male with ILD and positive Rheumatoid Factor who is presenting for a 4 week follow up. He was last seen on 01/04/2025 where labs and x-rays where ordered.     Activities of Daily Living:  Patient reports morning stiffness for *** {minute/hour:19697}.   Patient {ACTIONS;DENIES/REPORTS:21021675::Denies} nocturnal pain.  Difficulty dressing/grooming: {ACTIONS;DENIES/REPORTS:21021675::Denies} Difficulty climbing stairs: {ACTIONS;DENIES/REPORTS:21021675::Denies} Difficulty getting out of chair: {ACTIONS;DENIES/REPORTS:21021675::Denies} Difficulty using hands for taps, buttons, cutlery, and/or writing: {ACTIONS;DENIES/REPORTS:21021675::Denies}  No Rheumatology ROS completed.   PMFS History:  Patient Active Problem List   Diagnosis Date Noted   BPH (benign prostatic hyperplasia) 01/10/2025   Moderate persistent asthma 12/26/2024   History of hiatal hernia 12/26/2024   History of Epstein-Barr virus infection 12/26/2024   Nocturia 12/26/2024   Aneurysm of ascending aorta without rupture 07/15/2023   Interstitial lung disease (HCC) 07/15/2023   Pulmonary nodule 06/13/2023   Status post catheter ablation of atrial fibrillation 01/25/2023   OSA (obstructive sleep apnea) 02/02/2022   Paroxysmal atrial fibrillation (HCC) 12/23/2021   Atrial fibrillation (HCC) 12/03/2021   Atrial fibrillation with rapid ventricular response (HCC) 12/02/2021   GERD (gastroesophageal reflux disease)    Lactose intolerance 02/02/2019   Prediabetes 05/02/2018   Pituitary tumor 12/07/2012   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   Hyperlipidemia 09/23/2011    Restless leg syndrome 09/23/2011   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   Pituitary adenoma (HCC) 2011    Past Medical History:  Diagnosis Date   Atrial fibrillation (HCC)    BPH (benign prostatic hyperplasia)    Esophageal stricture    GERD (gastroesophageal reflux disease)    Gynecomastia, male    History of Epstein-Barr virus infection 2008   History of secondary hypogonadism    Hx of scarlet fever    Hypothyroidism    ILD (interstitial lung disease) (HCC)    Pituitary adenoma (HCC) 2011   S/P resection   Plantar fasciitis    Pre-diabetes    Retinal tear of right eye    RLS (restless legs syndrome)    Shortness of breath/respiratory failure with hypoxia 12/04/2021   Sleep apnea    No CPAP    Family History  Problem Relation Age of Onset   Pulmonary fibrosis Father    Cancer Maternal Grandmother    Cancer Maternal Grandfather    Pulmonary fibrosis Paternal Grandmother    Cancer Paternal Grandfather    Past Surgical History:  Procedure Laterality Date   ATRIAL FIBRILLATION ABLATION     ATRIAL FIBRILLATION ABLATION     BREAST BIOPSY Right    BUNIONECTOMY Right 12/2008   Great toe   COLONOSCOPY WITH ESOPHAGOGASTRODUODENOSCOPY (EGD)     2008, 2018   FRACTURE SURGERY Right    Collarbone   PARS PLANA VITRECTOMY Right 02/07/2019   PITUITARY EXCISION  2011   Social History[1] Social History   Social History Narrative   Lives locally w/ wife.  [place] - very active.  Rides mountain bike fairly regularly w/o limitations.     Immunization History  Administered Date(s)  Administered   PFIZER Comirnaty(Gray Top)Covid-19 Tri-Sucrose Vaccine 03/21/2020, 04/15/2020   Pfizer Covid-19 Vaccine Bivalent Booster 39yrs & up 09/25/2021   Pfizer(Comirnaty)Fall Seasonal Vaccine 12 years and older 09/05/2024   Unspecified SARS-COV-2 Vaccination 05/22/2021     Objective: Vital Signs: There were no vitals taken for this visit.   Physical Exam   Musculoskeletal Exam:  ***  CDAI Exam: CDAI Score: -- Patient Global: --; Provider Global: -- Swollen: --; Tender: -- Joint Exam 02/08/2025   No joint exam has been documented for this visit   There is currently no information documented on the homunculus. Go to the Rheumatology activity and complete the homunculus joint exam.  Investigation: No additional findings.  Imaging: No results found.  Recent Labs: Lab Results  Component Value Date   WBC 6.5 09/28/2024   HGB 13.7 09/28/2024   PLT 211 09/28/2024   NA 139 09/28/2024   K 3.9 09/28/2024   CL 105 09/28/2024   CO2 28 09/28/2024   GLUCOSE 107 (H) 09/28/2024   BUN 19 09/28/2024   CREATININE 0.95 09/28/2024   BILITOT 0.4 09/28/2024   ALKPHOS 89 09/28/2024   AST 22 09/28/2024   ALT 20 09/28/2024   PROT 7.2 09/28/2024   ALBUMIN 3.9 09/28/2024   CALCIUM 9.0 09/28/2024    Speciality Comments: No specialty comments available.  Procedures:  No procedures performed Allergies: Milk (cow)   Assessment / Plan:     Visit Diagnoses: No diagnosis found.  Orders: No orders of the defined types were placed in this encounter.  No orders of the defined types were placed in this encounter.   Face-to-face time spent with patient was *** minutes. Greater than 50% of time was spent in counseling and coordination of care.  Follow-Up Instructions: No follow-ups on file.   Alfonso Patterson, LPN  Note - This record has been created using [employer].  Chart creation errors have been sought, but may not always  have been located. Such creation errors do not reflect on  the standard of medical care.    [1]  Social History Tobacco Use   Smoking status: Never    Passive exposure: Past   Smokeless tobacco: Never  Vaping Use   Vaping status: Never Used  Substance Use Topics   Alcohol use: Yes    Alcohol/week: 2.0 standard drinks of alcohol    Types: 2 Glasses of wine per week    Comment: occasional drink   Drug use: Yes    Comment: THC  Gummies   "

## 2025-02-05 ENCOUNTER — Other Ambulatory Visit

## 2025-02-07 ENCOUNTER — Ambulatory Visit

## 2025-02-08 ENCOUNTER — Ambulatory Visit

## 2025-02-08 DIAGNOSIS — R7689 Other specified abnormal immunological findings in serum: Secondary | ICD-10-CM

## 2025-02-08 DIAGNOSIS — J849 Interstitial pulmonary disease, unspecified: Secondary | ICD-10-CM

## 2025-02-15 ENCOUNTER — Ambulatory Visit: Admitting: Family Medicine

## 2025-02-23 ENCOUNTER — Ambulatory Visit: Admitting: Urology

## 2025-03-07 ENCOUNTER — Ambulatory Visit: Admitting: Internal Medicine

## 2025-04-04 ENCOUNTER — Ambulatory Visit
